# Patient Record
Sex: Female | Born: 1960 | Hispanic: No | Marital: Married | Smoking: Never smoker
Health system: Southern US, Community
[De-identification: ages and names within clinical notes are randomized; demographics above are authoritative.]

## PROBLEM LIST (undated history)

## (undated) DIAGNOSIS — R519 Headache, unspecified: Secondary | ICD-10-CM

## (undated) DIAGNOSIS — R51 Headache: Secondary | ICD-10-CM

## (undated) DIAGNOSIS — R42 Dizziness and giddiness: Secondary | ICD-10-CM

## (undated) DIAGNOSIS — I10 Essential (primary) hypertension: Secondary | ICD-10-CM

## (undated) HISTORY — DX: Dizziness and giddiness: R42

## (undated) HISTORY — DX: Headache, unspecified: R51.9

## (undated) HISTORY — PX: OTHER SURGICAL HISTORY: SHX169

## (undated) HISTORY — DX: Essential (primary) hypertension: I10

## (undated) HISTORY — DX: Headache: R51

---

## 1998-06-15 HISTORY — PX: CHOLECYSTECTOMY: SHX55

## 2006-09-27 ENCOUNTER — Emergency Department: Payer: Self-pay | Admitting: Emergency Medicine

## 2008-01-26 ENCOUNTER — Emergency Department: Payer: Self-pay | Admitting: Emergency Medicine

## 2009-03-18 ENCOUNTER — Ambulatory Visit: Payer: Self-pay | Admitting: Family Medicine

## 2009-04-03 ENCOUNTER — Ambulatory Visit: Payer: Self-pay | Admitting: Gastroenterology

## 2009-04-05 ENCOUNTER — Ambulatory Visit: Payer: Self-pay | Admitting: Unknown Physician Specialty

## 2009-04-20 ENCOUNTER — Emergency Department: Payer: Self-pay | Admitting: Emergency Medicine

## 2009-05-21 ENCOUNTER — Ambulatory Visit: Payer: Self-pay | Admitting: Obstetrics and Gynecology

## 2009-05-30 ENCOUNTER — Observation Stay: Payer: Self-pay | Admitting: Specialist

## 2009-07-03 ENCOUNTER — Ambulatory Visit: Payer: Self-pay | Admitting: Gastroenterology

## 2009-07-04 ENCOUNTER — Ambulatory Visit: Payer: Self-pay | Admitting: Internal Medicine

## 2009-12-13 ENCOUNTER — Emergency Department: Payer: Self-pay | Admitting: Emergency Medicine

## 2010-06-17 IMAGING — CR DG CHEST 1V PORT
1 series · 1 of 1 positions shown · non-contrast
Comparison: none

REASON FOR EXAM: Chest Pain
COMMENTS:

[view not recorded]
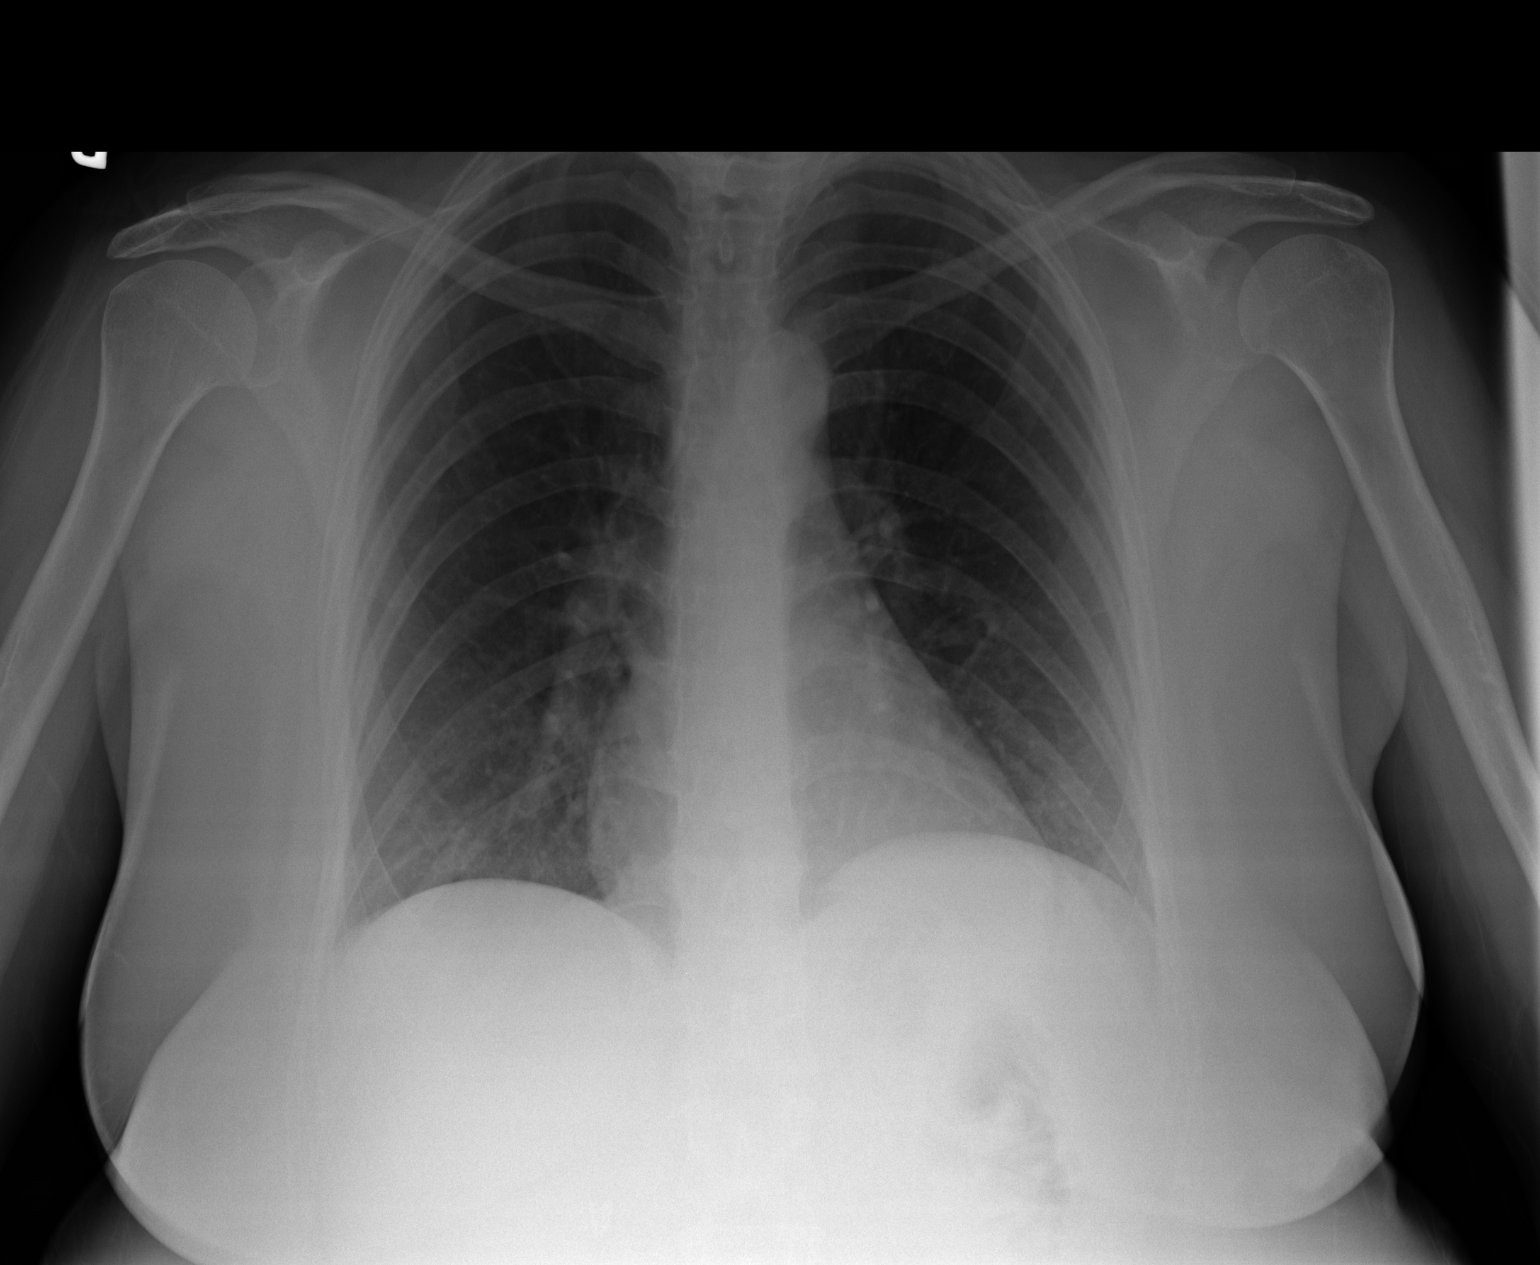

[1 of 1 positions shown; findings below may reference images not displayed]

PROCEDURE:     DXR - DXR PORTABLE CHEST SINGLE VIEW  - April 21, 2009  [DATE]

RESULT:     Comparison is made to the exam of 09/27/2006.

The lungs are clear. The heart and pulmonary vessels are normal. The bony
and mediastinal structures are unremarkable. There is no effusion. There is
no pneumothorax or evidence of congestive failure.
IMPRESSION: No acute cardiopulmonary disease.

## 2012-03-03 ENCOUNTER — Ambulatory Visit: Payer: Self-pay | Admitting: Unknown Physician Specialty

## 2013-08-13 HISTORY — PX: BREAST SURGERY: SHX581

## 2013-08-16 ENCOUNTER — Ambulatory Visit: Payer: Self-pay

## 2013-08-17 ENCOUNTER — Ambulatory Visit: Payer: Self-pay

## 2013-09-04 ENCOUNTER — Ambulatory Visit (INDEPENDENT_AMBULATORY_CARE_PROVIDER_SITE_OTHER): Payer: PRIVATE HEALTH INSURANCE | Admitting: General Surgery

## 2013-09-04 ENCOUNTER — Encounter: Payer: Self-pay | Admitting: General Surgery

## 2013-09-04 VITALS — BP 140/90 | HR 74 | Resp 12 | Ht 61.0 in | Wt 158.0 lb

## 2013-09-04 DIAGNOSIS — R21 Rash and other nonspecific skin eruption: Secondary | ICD-10-CM

## 2013-09-04 NOTE — Patient Instructions (Signed)
Patient to return in one week nurse visit.

## 2013-09-04 NOTE — Progress Notes (Signed)
Patient ID: Julie Cantu, female   DOB: 09/11/1960, 53 y.o.   MRN: [id number]  Chief Complaint  Patient presents with  . Other    mammogram    HPI Julie Cantu is a 53 y.o. female who presents for a breast evaluation Ref. Julie Cantu.The most recent mammogram and ultrasound was done on 08/17/13. Patient states her left breast has red spots on the top of her breast and has been there since January 2015. The patient denies any trauma or change in laundry detergent/underclothes. There is no pruritus associated with the areas. No past history of similar episodes involving the breast or other areas of the skin.  Patient states she does not feel any lumps.Patient does perform regular self breast checks and does not get regular mammograms done. (These are started at age 11 in [place]). No family history of breast cancer.  The patient was born in [place], but raised in [place]. She is accompanied today by her husband who was born in [place], raised in [place]. The met while at [facility] in [place]. 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 Her husband was present for the interview and exam.  The patient reports of the most superior area of redness developed first at the 11 o'clock position, the secondary to develop was at the 5 o'clock position left breast and the most recent area beginning about 3 weeks ago was just outside the edge of the areola at the 3 o'clock position. The areas. The degree of redness. Her husband reports that at times it seems the nipple/areolar tissue is more swollen. She denies any nipple drainage.  HPI  Past Medical History  Diagnosis Date  . Hypertension   . Headache   . Dizziness     Past Surgical History  Procedure Laterality Date  . Arm surgery Right   . Cholecystectomy  2000    No family history on file.  Social History History  Substance Use Topics  . Smoking status: Never Smoker   . Smokeless  tobacco: Never Used  . Alcohol Use: No    Allergies  Allergen Reactions  . Ivp Dye [Iodinated Diagnostic Agents] Rash    Current Outpatient Prescriptions  Medication Sig Dispense Refill  . ibuprofen (ADVIL,MOTRIN) 200 MG tablet Take 200 mg by mouth every 6 (six) hours as needed.      Marland Kitchen lisinopril (PRINIVIL,ZESTRIL) 10 MG tablet Take 10 mg by mouth daily.      . Nutritional Supplements (ESTROVEN ENERGY PO) Take by mouth.      . propranolol (INDERAL) 40 MG tablet Take 40 mg by mouth 3 (three) times daily.      . verapamil (CALAN) 120 MG tablet Take 120 mg by mouth 3 (three) times daily.       No current facility-administered medications for this visit.    Review of Systems Review of Systems  Constitutional: Negative.   Respiratory: Negative.   Cardiovascular: Negative.     Blood pressure 140/90, pulse 74, resp. rate 12, height _0  (1.549 m), weight 158 lb (71.668 kg).  Physical Exam Physical Exam  Constitutional: She is oriented to person, place, and time. She appears well-developed and well-nourished.  Eyes: Conjunctivae are normal.  Neck: Neck supple.  Cardiovascular: Normal rate, regular rhythm and normal heart sounds.   Pulmonary/Chest: Effort normal and breath sounds normal. Right breast exhibits no inverted nipple, no mass, no nipple discharge, no skin change and no tenderness. Left breast exhibits no inverted nipple,  no mass, no nipple discharge, no skin change and no tenderness.  Lymphadenopathy:    She has no cervical adenopathy.    She has no axillary adenopathy.  Neurological: She is alert and oriented to person, place, and time.  Skin: Skin is warm and dry.    Data Reviewed Bilateral mammograms in August 17, 2013 were reviewed. No films for comparison. No abnormalities appreciated. Ultrasound reported as negative. BI-RAD-1.  Assessment    Atypical rash involving the left breast.     Plan    With the reports of intermittent swelling and increased redness  in the retroareolar tissue, biopsy was recommended to assess for a deeper inflammatory process. The patient was amenable to have this completed. 3 cc of 1% plain Xylocaine was utilized. The skin was prepped with ChloraPrep and draped. A 2.5 mm punch biopsy of the skin was completed and sent in formalin for routine histology. This was completed just at the edge of the areola. The skin defect was closed with a single 4-0 nylon suture followed by a Telfa and Tegaderm dressing.  The patient was instructed she may remove the outer dressing in 2-3 days, and shower at any time. She will return in one week for suture removal with the staff. She be contacted once pathology is available.        Robert Bellow 09/04/2013, 9:22 PM

## 2013-09-05 LAB — PATHOLOGY

## 2013-09-06 ENCOUNTER — Telehealth: Payer: Self-pay | Admitting: *Deleted

## 2013-09-06 MED ORDER — CLOBETASOL PROPIONATE 0.05 % EX CREA: 1.0000 | TOPICAL_CREAM | Freq: Two times a day (BID) | CUTANEOUS | Status: DC

## 2013-09-06 NOTE — Telephone Encounter (Signed)
Let the patient know that the biopsies came out without any evidence of malignancy. There is a suggestion of an inflammatory process. The pathologist was concerned it might be related to one of the medications she has been taking. Find out if any of her medications have been added in the last 6 months. See if she is taking anything besides the 3 blood pressure pills, the Estroven and ibuprofen, and how long she been taking any of those medications. Send in prescriptions for clobetasol cream 0.05%, 30 g to be applied twice a day. She should give us a progress report in 3 weeks by phone.

## 2013-09-06 NOTE — Telephone Encounter (Signed)
Notified patient as instructed, patient pleased. Discussed follow-up appointments, patient agrees. Medications reviewed Verapamil she takes PRN last dose 09-05-13 and she has been on that >6 months. Propranolol BID started in 2004. Lisinopril she takes as needed last dose was one week ago. Nutritional OTC energy supplement has been with in the past 6 months She is aware that a new cream was called is aware to call back with status report.

## 2013-09-11 ENCOUNTER — Ambulatory Visit (INDEPENDENT_AMBULATORY_CARE_PROVIDER_SITE_OTHER): Payer: Self-pay | Admitting: *Deleted

## 2013-09-11 DIAGNOSIS — R21 Rash and other nonspecific skin eruption: Secondary | ICD-10-CM

## 2013-09-11 NOTE — Progress Notes (Signed)
Patient came in today for a wound check right breast rash.  The wound is clean, with no signs of infection noted,sutures removed. Follow up as scheduled.

## 2013-09-28 ENCOUNTER — Encounter: Payer: Self-pay | Admitting: General Surgery

## 2013-09-28 ENCOUNTER — Ambulatory Visit (INDEPENDENT_AMBULATORY_CARE_PROVIDER_SITE_OTHER): Payer: Self-pay | Admitting: General Surgery

## 2013-09-28 VITALS — BP 132/78 | HR 76 | Resp 12 | Ht 61.0 in | Wt 147.0 lb

## 2013-09-28 DIAGNOSIS — R21 Rash and other nonspecific skin eruption: Secondary | ICD-10-CM

## 2013-09-28 NOTE — Patient Instructions (Signed)
Patient to return as needed. Continue self breast exams. Call office for any new breast issues or concerns.'  

## 2013-09-28 NOTE — Progress Notes (Signed)
Patient ID: Julie Cantu, female   DOB: Sep 11, 1960, 53 y.o.   MRN: [id number]  Chief Complaint  Patient presents with  . Routine Post Op    left breast biopsy    HPI Julie Cantu is a [age] y.o. female here today for her post op left breast biopsy which was done on 09/04/13. Patient states she had some redness but now has cleaned up.  She denise  any new breast problems at this time.  HPI  Past Medical History  Diagnosis Date  . Hypertension   . Headache   . Dizziness     Past Surgical History  Procedure Laterality Date  . Arm surgery Right   . Cholecystectomy  2000  . Breast surgery Right 08/2013    right breast core     No family history on file.  Social History History  Substance Use Topics  . Smoking status: Never Smoker   . Smokeless tobacco: Never Used  . Alcohol Use: No    Allergies  Allergen Reactions  . Ivp Dye [Iodinated Diagnostic Agents] Rash    Current Outpatient Prescriptions  Medication Sig Dispense Refill  . clobetasol cream (TEMOVATE) 0.05 % Apply 1 application topically 2 (two) times daily.  30 g  0  . ibuprofen (ADVIL,MOTRIN) 200 MG tablet Take 200 mg by mouth every 6 (six) hours as needed.      Marland Kitchen. lisinopril (PRINIVIL,ZESTRIL) 10 MG tablet Take 10 mg by mouth as needed.       . Nutritional Supplements (ESTROVEN ENERGY PO) Take by mouth.      . propranolol (INDERAL) 40 MG tablet Take 40 mg by mouth 2 (two) times daily.       . verapamil (CALAN) 120 MG tablet Take 120 mg by mouth as needed.        No current facility-administered medications for this visit.    Review of Systems Review of Systems  Constitutional: Negative.   Respiratory: Negative.   Cardiovascular: Negative.     Blood pressure 132/78, pulse 76, resp. rate 12, height 5\' 1"  (1.549 m), weight 147 lb (66.679 kg).  Physical Exam Physical Exam  Pulmonary/Chest:    Left breast well healed incision 6 mm area of redness at biopsy site.    Data  Reviewed Diagnosis: LEFT BREAST 3:00 CORE BIOPSY *STAT*: - MILD SUPERFICIAL PERIVASCULAR LYMPHOPLASMACYTIC INFLAMMATION WITH FOCAL EOSINOPHILS. - NEGATIVE FOR MALIGNANCY.    Assessment    Resolving rash, unclear etiology.     Plan    The patient will report if she develops recurrent symptoms, follow up otherwise will be on an as-needed basis.        Merrily PewJeffrey W Adonys Wildes 09/28/2013, 8:58 PM

## 2019-06-27 ENCOUNTER — Other Ambulatory Visit: Payer: Self-pay

## 2020-03-08 ENCOUNTER — Other Ambulatory Visit: Payer: Self-pay | Admitting: Obstetrics and Gynecology

## 2020-03-08 DIAGNOSIS — Z1231 Encounter for screening mammogram for malignant neoplasm of breast: Secondary | ICD-10-CM

## 2020-06-10 ENCOUNTER — Other Ambulatory Visit: Payer: Self-pay

## 2020-06-10 DIAGNOSIS — Z20822 Contact with and (suspected) exposure to covid-19: Secondary | ICD-10-CM

## 2020-06-11 LAB — SARS-COV-2, NAA 2 DAY TAT

## 2020-06-11 LAB — NOVEL CORONAVIRUS, NAA: SARS-CoV-2, NAA: NOT DETECTED

## 2020-07-29 ENCOUNTER — Ambulatory Visit: Payer: BLUE CROSS/BLUE SHIELD | Admitting: Physical Therapy

## 2021-04-29 ENCOUNTER — Other Ambulatory Visit: Payer: Self-pay | Admitting: Obstetrics and Gynecology

## 2021-04-29 DIAGNOSIS — Z1231 Encounter for screening mammogram for malignant neoplasm of breast: Secondary | ICD-10-CM

## 2021-08-15 ENCOUNTER — Ambulatory Visit
Admission: RE | Admit: 2021-08-15 | Discharge: 2021-08-15 | Disposition: A | Discharge status: Home / Self Care | Payer: 59 | Source: Ambulatory Visit | Attending: Obstetrics and Gynecology | Admitting: Obstetrics and Gynecology

## 2021-08-15 ENCOUNTER — Other Ambulatory Visit: Payer: Self-pay

## 2021-08-15 DIAGNOSIS — Z1231 Encounter for screening mammogram for malignant neoplasm of breast: Secondary | ICD-10-CM | POA: Diagnosis present

## 2021-10-10 ENCOUNTER — Other Ambulatory Visit: Payer: Self-pay

## 2021-10-10 ENCOUNTER — Emergency Department
Admission: EM | Admit: 2021-10-10 | Discharge: 2021-10-10 | Disposition: A | Discharge status: Home / Self Care | Payer: 59 | Attending: Emergency Medicine | Admitting: Emergency Medicine

## 2021-10-10 ENCOUNTER — Emergency Department: Payer: 59

## 2021-10-10 DIAGNOSIS — I10 Essential (primary) hypertension: Secondary | ICD-10-CM | POA: Insufficient documentation

## 2021-10-10 DIAGNOSIS — R109 Unspecified abdominal pain: Secondary | ICD-10-CM | POA: Insufficient documentation

## 2021-10-10 DIAGNOSIS — R519 Headache, unspecified: Secondary | ICD-10-CM

## 2021-10-10 LAB — CBC
HCT: 44.7 % (ref 36.0–46.0)
Hemoglobin: 15.6 g/dL — ABNORMAL HIGH (ref 12.0–15.0)
MCH: 30.2 pg (ref 26.0–34.0)
MCHC: 34.9 g/dL (ref 30.0–36.0)
MCV: 86.5 fL (ref 80.0–100.0)
Platelets: 304 10*3/uL (ref 150–400)
RBC: 5.17 MIL/uL — ABNORMAL HIGH (ref 3.87–5.11)
RDW: 13 % (ref 11.5–15.5)
WBC: 11.8 10*3/uL — ABNORMAL HIGH (ref 4.0–10.5)
nRBC: 0 % (ref 0.0–0.2)

## 2021-10-10 LAB — URINALYSIS, ROUTINE W REFLEX MICROSCOPIC
Bilirubin Urine: NEGATIVE
Glucose, UA: NEGATIVE mg/dL
Hgb urine dipstick: NEGATIVE
Ketones, ur: NEGATIVE mg/dL
Leukocytes,Ua: NEGATIVE
Nitrite: NEGATIVE
Protein, ur: NEGATIVE mg/dL
Specific Gravity, Urine: 1.009 (ref 1.005–1.030)
pH: 7 (ref 5.0–8.0)

## 2021-10-10 LAB — BASIC METABOLIC PANEL
Anion gap: 8 (ref 5–15)
BUN: 16 mg/dL (ref 6–20)
CO2: 26 mmol/L (ref 22–32)
Calcium: 9.4 mg/dL (ref 8.9–10.3)
Chloride: 105 mmol/L (ref 98–111)
Creatinine, Ser: 0.68 mg/dL (ref 0.44–1.00)
GFR, Estimated: >60 mL/min (ref >60)
Glucose, Bld: 110 mg/dL — ABNORMAL HIGH (ref 70–99)
Potassium: 3.6 mmol/L (ref 3.5–5.1)
Sodium: 139 mmol/L (ref 135–145)

## 2021-10-10 LAB — TROPONIN I (HIGH SENSITIVITY): Troponin I (High Sensitivity): 6 ng/L (ref <18)

## 2021-10-10 MED ORDER — CLONIDINE HCL 0.1 MG PO TABS: 0.1000 mg | ORAL_TABLET | Freq: Two times a day (BID) | ORAL | 0 refills | Status: DC | PRN

## 2021-10-10 MED ORDER — ACETAMINOPHEN 500 MG PO TABS
1000.0000 mg | ORAL_TABLET | Freq: Once | ORAL | Status: AC
  Administered 2021-10-10: 1000 mg via ORAL
  Filled 2021-10-10: qty 2

## 2021-10-10 MED ORDER — CLONIDINE HCL 0.1 MG PO TABS
0.1000 mg | ORAL_TABLET | Freq: Once | ORAL | Status: AC
  Administered 2021-10-10: 0.1 mg via ORAL
  Filled 2021-10-10: qty 1

## 2021-10-10 NOTE — Discharge Instructions (Addendum)
For your blood pressure: ? ?If your headache resolves and you otherwise feel well, you likely do not need to take any additional medication. ? ?IF you develop headache or other symptoms, and your BP remains >180 on top or >100 on bottom: ?- Take the Clonidine 0.1 mg tablet, rest, and re-check in 20-30 minutes ?- If symptoms are not improved, call your doctor or present to the ED ?- You can take the clonidine up to twice a day ? ?Call your primary to discuss further medication changes as indicated ? ?Take Tylenol for headache as well ?

## 2021-10-10 NOTE — ED Notes (Signed)
Patient is at imaging. 

## 2021-10-10 NOTE — ED Triage Notes (Addendum)
Pt arrives with c/o hypertension and headache that started this morning. Pt has been taking BP as prescribed. Pt takes lopressor. Pt endorse nausea.  ?

## 2021-10-10 NOTE — ED Provider Notes (Signed)
? ?[facility] ?Provider Note ? ? ? Event Date/Time  ? First MD Initiated Contact with Patient 10/10/21 1814   ?  (approximate) ? ? ?History  ? ?Hypertension ? ? ?HPI ? ?Agripina Olortegui-Green is a 61 y.o. female with past medical history of hypertension here with headache, high blood pressure.  The patient states that for the last 2 days or so, she has had a fairly constant aching, throbbing, headache.  She has a history of hypertension and has felt similar symptoms with high blood pressure in the past.  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  She states the headache is mild, generalized, not acute in onset.  No focal numbness or weakness.  Denies any chest pain.  She has some transient left-sided flank pain, but states this has not been persistent.  She had some transient mild urinary frequency yesterday, but this is also resolved.  No fevers or chills.  No specific alleviating or aggravating factors. ?  ? ? ?Physical Exam  ? ?Triage Vital Signs: ?ED Triage Vitals  ?Enc Vitals Group  ?   BP 10/10/21 1611 (!) 176/100  ?   Pulse Rate 10/10/21 1611 (!) 56  ?   Resp 10/10/21 1611 18  ?   Temp 10/10/21 1611 99.1 ?F (37.3 ?C)  ?   Temp Source 10/10/21 1611 Oral  ?   SpO2 10/10/21 1611 98 %  ?   Weight 10/10/21 1612 148 lb (67.1 kg)  ?   Height 10/10/21 1612 5\' 2"  (1.575 m)  ?   Head Circumference --   ?   Peak Flow --   ?   Pain Score 10/10/21 1612 8  ?   Pain Loc --   ?   Pain Edu? --   ?   Excl. in GC? --   ? ? ?Most recent vital signs: ?Vitals:  ? 10/10/21 1936 10/10/21 1959  ?BP: (!) 168/84 (!) 160/75  ?Pulse: (!) 51 (!) 52  ?Resp:  16  ?Temp:  98.2 ?F (36.8 ?C)  ?SpO2:  97%  ? ? ? ?General: Awake, no distress.  ?CV:  Good peripheral perfusion.  Regular rate and rhythm.  No murmurs or rubs. ?Resp:  Normal effort.  Lungs clear to auscultation bilaterally. ?Abd:  No distention.  Minimal left flank tenderness to  palpation.  No CVA tenderness.  No rebound or guarding. ?Other:  Current nerves II through XII intact.  Strength out of 5 bilateral upper and lower extremities.  Normal sensation to light touch.  No focal neurological deficits. ? ? ?ED Results / Procedures / Treatments  ? ?Labs ?(all labs ordered are listed, but only abnormal results are displayed) ?Labs Reviewed  ?BASIC METABOLIC PANEL - Abnormal; Notable for the following components:  ?    Result Value  ? Glucose, Bld 110 (*)   ? All other components within normal limits  ?CBC - Abnormal; Notable for the following components:  ? WBC 11.8 (*)   ? RBC 5.17 (*)   ? Hemoglobin 15.6 (*)   ? All other components within normal limits  ?URINALYSIS, ROUTINE W REFLEX MICROSCOPIC - Abnormal; Notable for the following components:  ? Color, Urine STRAW (*)   ? APPearance CLEAR (*)   ? All other components within normal limits  ?TROPONIN I (HIGH SENSITIVITY)  ? ? ? ?EKG ?Sinus bradycardia, ventricular 51.  PR 148, QRS 86, QTc 4  7.  No acute ST elevations or depressions.  No acute events of acute ischemia or infarct. ? ? ?RADIOLOGY ?CT Head: [facility] ?CXR: No acute disease ? ? ?I also independently reviewed and agree with radiologist interpretations. ? ? ?PROCEDURES: ? ?Critical Care performed: No ? ? ?MEDICATIONS ORDERED IN ED: ?Medications  ?cloNIDine (CATAPRES) tablet 0.1 mg (0.1 mg Oral Given 10/10/21 1903)  ?acetaminophen (TYLENOL) tablet 1,000 mg (1,000 mg Oral Given 10/10/21 1903)  ? ? ? ?IMPRESSION / MDM / ASSESSMENT AND PLAN / ED COURSE  ?I reviewed the triage vital signs and the nursing notes. ?             ?               ? ? ?The patient is on the cardiac monitor to evaluate for evidence of arrhythmia and/or significant heart rate changes. ? ? ?Ddx:  ?Symptomatic HTN, migraine, tension headache, dehydration/electrolyte abnormality ? ? ?MDM:  ?61 year old female here with multiple complaints, primarily headache and intermittent blurry vision which I suspect is due to  hypertensive urgency.  Patient has a history of migraines, as well as symptomatic headaches with hypertension.  She received steroid injections on Monday, and symptoms began approximately 48 hours later, which I suspect is related.  Here, she has no focal neurological deficits.  No fever, meningismus, or signs to suggest meningitis or encephalitis.  Headache began gradually and I do not suspect subarachnoid clinically.  CT head obtained, reviewed, shows no evidence of acute abnormality.  Lab work is very reassuring with no evidence of acute renal failure, elevated troponin, or signs of hypertensive emergency.  Chest x-ray is clear with normal mediastinum, no signs of CHF.  She has some transient left-sided flank pain which seems more musculoskeletal in etiology as it is reproducible on exam and positional.  The pain is not sharp, tearing, do not suspect dissection clinically and pulses are symmetric bilaterally. ? ?Following clonidine, patient feels markedly improved.  I suspect she will likely have ongoing hypertension given her recent steroid use.  We will give her clonidine as needed for the next several days to have on hand, as well as have her call her PCP.  Otherwise, no apparent emergent medical pathology identified. ? ? ?MEDICATIONS GIVEN IN ED: ?Medications  ?cloNIDine (CATAPRES) tablet 0.1 mg (0.1 mg Oral Given 10/10/21 1903)  ?acetaminophen (TYLENOL) tablet 1,000 mg (1,000 mg Oral Given 10/10/21 1903)  ? ? ? ?Consults:  ?None ? ? ?EMR reviewed  ?Reviewed internal medicine notes from [facility], including office visit today ? ? ? ? ?FINAL CLINICAL IMPRESSION(S) / ED DIAGNOSES  ? ?Final diagnoses:  ?Primary hypertension  ?Acute nonintractable headache, unspecified headache type  ? ? ? ?Rx / DC Orders  ? ?ED Discharge Orders   ? ?      Ordered  ?  cloNIDine (CATAPRES) 0.1 MG tablet  2 times daily PRN       ? 10/10/21 1948  ? ?  ?  ? ?  ? ? ? ?Note:  This document was prepared using Dragon voice  recognition software and may include unintentional dictation errors. ?  ?Shaune Pollack, MD ?10/10/21 2207 ? ?

## 2021-11-28 ENCOUNTER — Other Ambulatory Visit: Payer: Self-pay | Admitting: Obstetrics and Gynecology

## 2021-12-30 ENCOUNTER — Other Ambulatory Visit: Payer: Self-pay | Admitting: Certified Nurse Midwife

## 2021-12-30 DIAGNOSIS — N644 Mastodynia: Secondary | ICD-10-CM

## 2021-12-31 ENCOUNTER — Other Ambulatory Visit: Payer: 59

## 2022-01-07 ENCOUNTER — Ambulatory Visit: Admit: 2022-01-07 | Payer: 59 | Admitting: Obstetrics and Gynecology

## 2022-01-07 SURGERY — DILATATION AND CURETTAGE /HYSTEROSCOPY
Anesthesia: Choice

## 2022-01-16 ENCOUNTER — Other Ambulatory Visit: Payer: 59

## 2022-01-16 ENCOUNTER — Inpatient Hospital Stay: Admission: RE | Admit: 2022-01-16 | Payer: 59 | Source: Ambulatory Visit

## 2022-02-10 ENCOUNTER — Other Ambulatory Visit: Payer: Self-pay | Admitting: Obstetrics and Gynecology

## 2022-03-02 ENCOUNTER — Ambulatory Visit
Admission: RE | Admit: 2022-03-02 | Discharge: 2022-03-02 | Disposition: A | Discharge status: Home / Self Care | Payer: 59 | Source: Ambulatory Visit | Attending: Certified Nurse Midwife | Admitting: Certified Nurse Midwife

## 2022-03-02 DIAGNOSIS — N644 Mastodynia: Secondary | ICD-10-CM | POA: Insufficient documentation

## 2022-03-02 DIAGNOSIS — B349 Viral infection, unspecified: Secondary | ICD-10-CM | POA: Diagnosis not present

## 2022-03-10 DIAGNOSIS — J019 Acute sinusitis, unspecified: Secondary | ICD-10-CM | POA: Diagnosis not present

## 2022-03-10 DIAGNOSIS — I10 Essential (primary) hypertension: Secondary | ICD-10-CM | POA: Diagnosis not present

## 2022-10-11 IMAGING — MG MM DIGITAL SCREENING BILAT W/ TOMO AND CAD
8 series · 8 of 24 positions shown · non-contrast
Comparison: Previous exam(s).

CLINICAL DATA: Screening.

EXAM:
DIGITAL SCREENING BILATERAL MAMMOGRAM WITH TOMOSYNTHESIS AND CAD
TECHNIQUE: Bilateral screening digital craniocaudal and mediolateral oblique
mammograms were obtained. Bilateral screening digital breast
tomosynthesis was performed. The images were evaluated with
computer-aided detection.

[L MLO synth-2D]
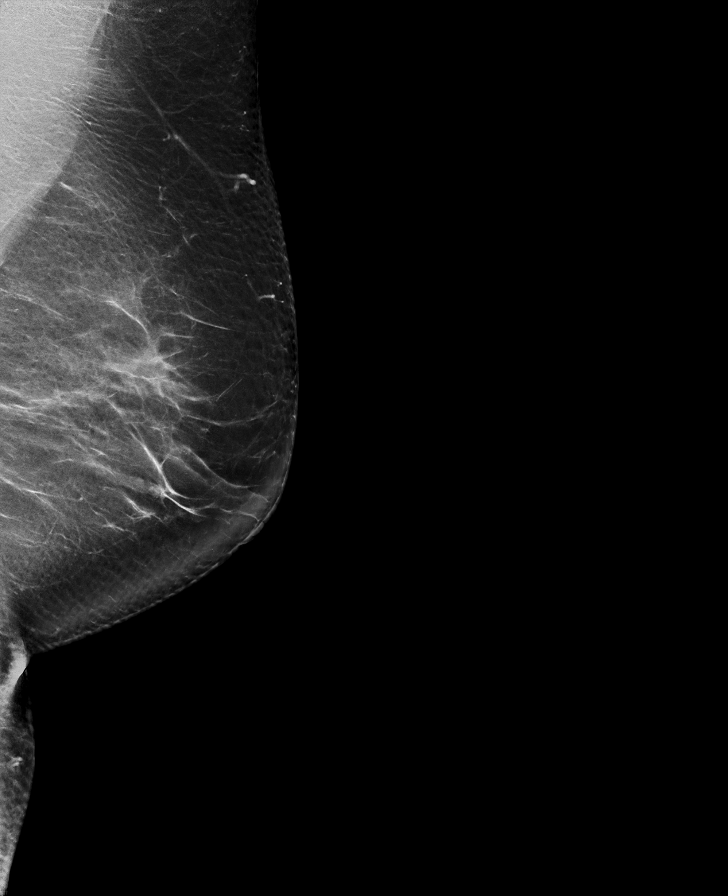

[R CC synth-2D]
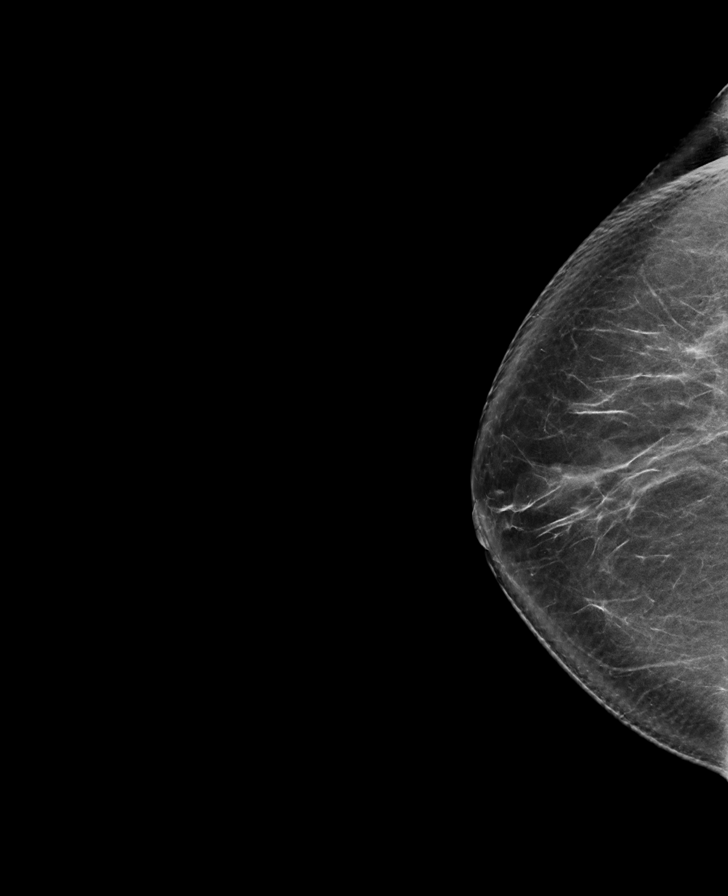

[L CC synth-2D]
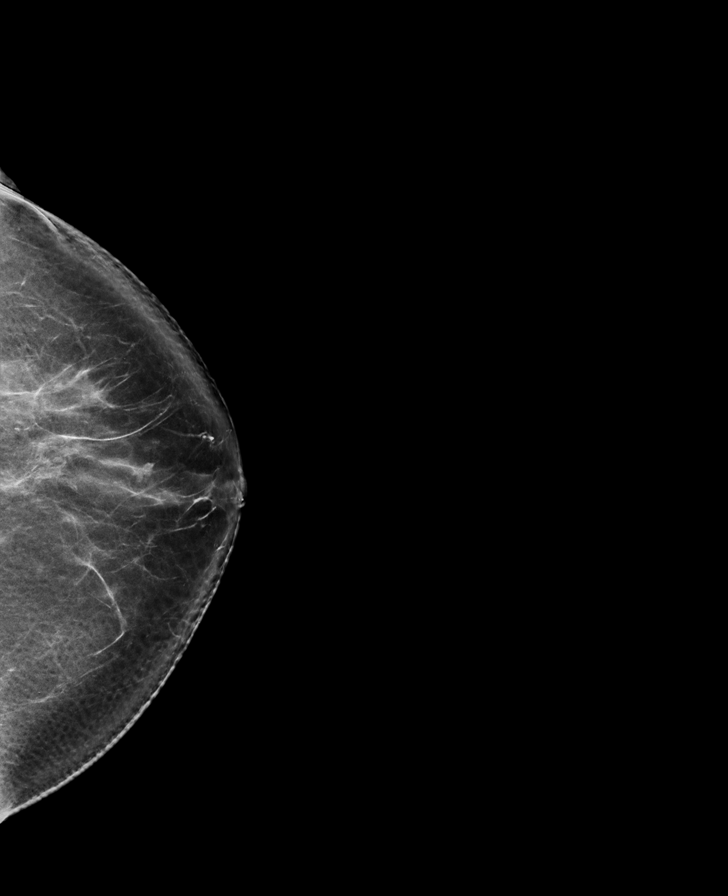

[R MLO synth-2D]
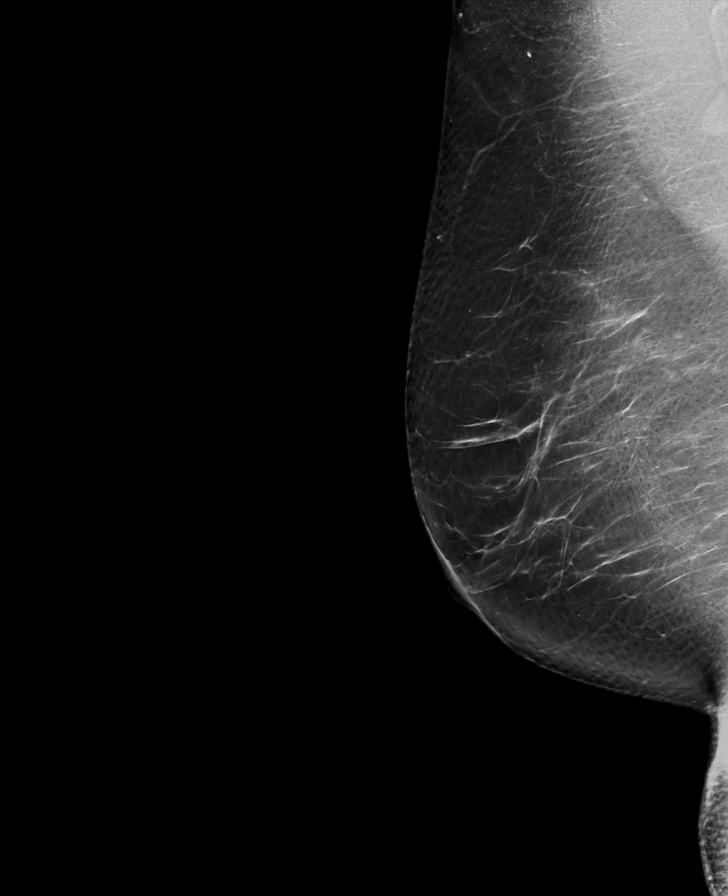

[L MLO tomo · tomo slice 49/96.0]
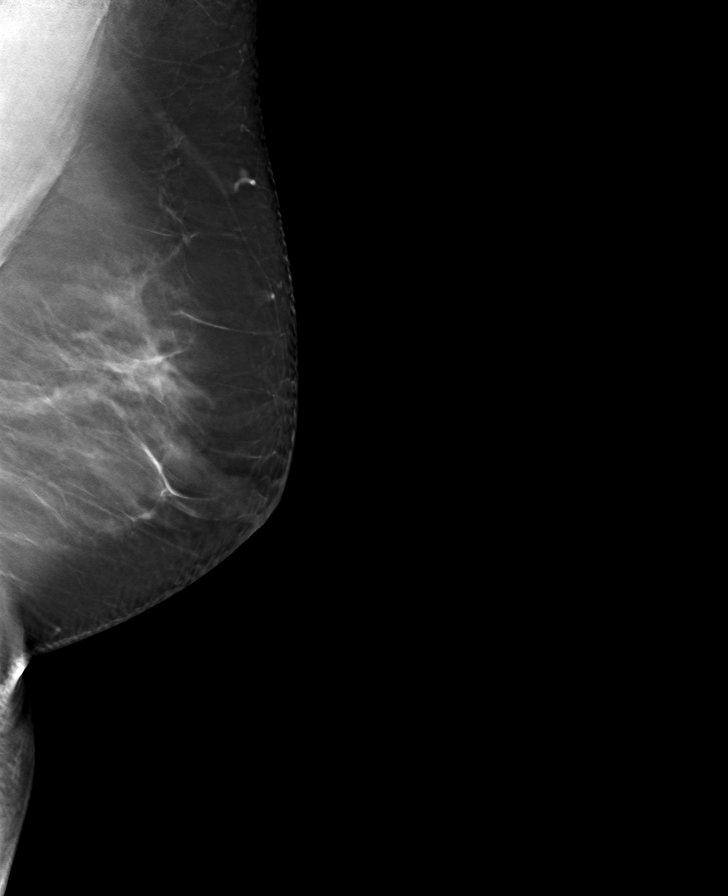

[R MLO tomo · tomo slice 49/97.0]
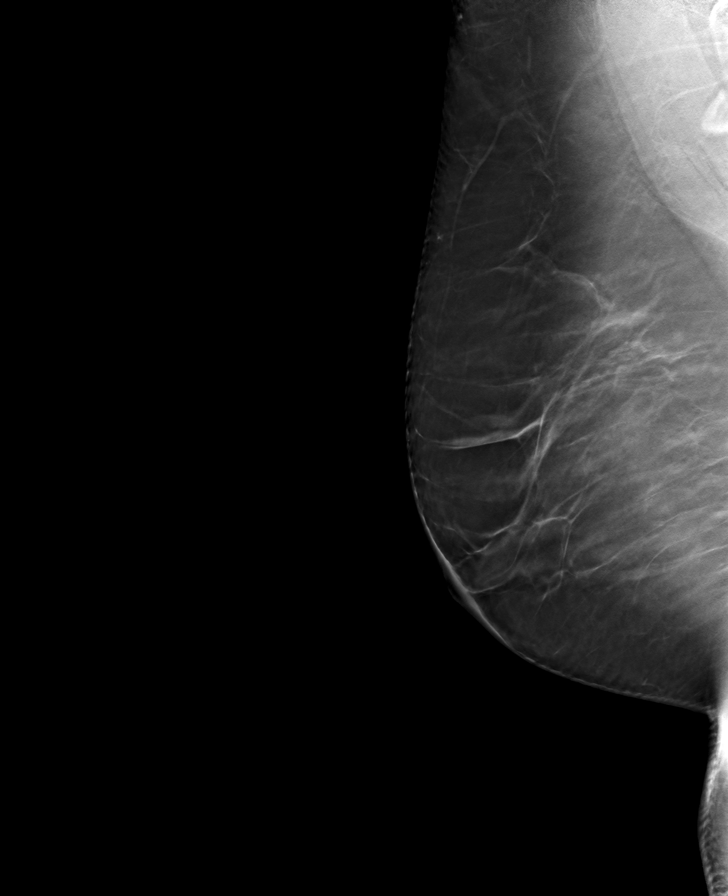

[R CC tomo · tomo slice 45/90.0]
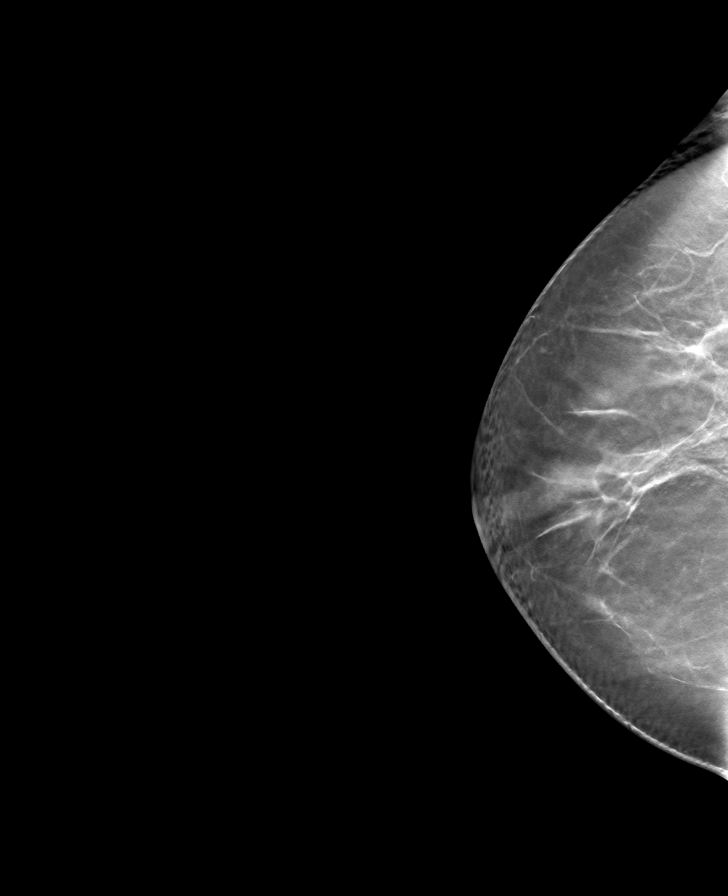

[L CC tomo · tomo slice 46/91.0]
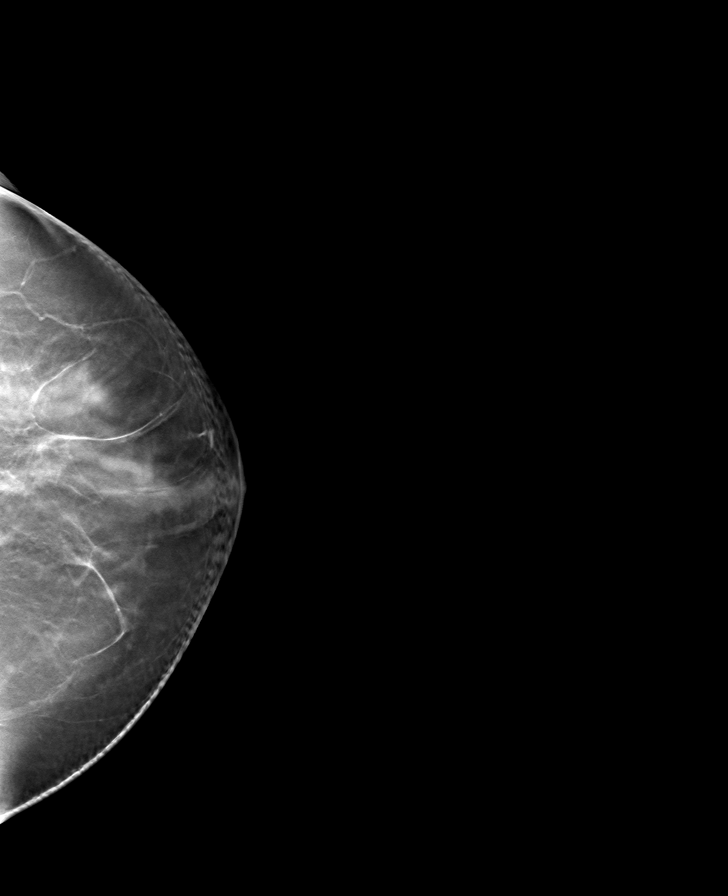

[8 of 24 positions shown; findings below may reference images not displayed]

ACR Breast Density Category b: There are scattered areas of
fibroglandular density.
FINDINGS: There are no findings suspicious for malignancy.
IMPRESSION: No mammographic evidence of malignancy. A result letter of this
screening mammogram will be mailed directly to the patient.

RECOMMENDATION:
Screening mammogram in one year. (Code:51-O-LD2)

BI-RADS CATEGORY  1: Negative.

## 2022-12-06 IMAGING — CT CT HEAD W/O CM
4 series · 16 of 47 positions shown, 18 images · non-contrast
Comparison: CT head 11/13/2009

CLINICAL DATA: Headache



[Series 2: head wo · axial · 0.44mm/px · z∈[-121,-1]mm · 7 of 33 slices shown, 9 images]
[im 5/33  brain]
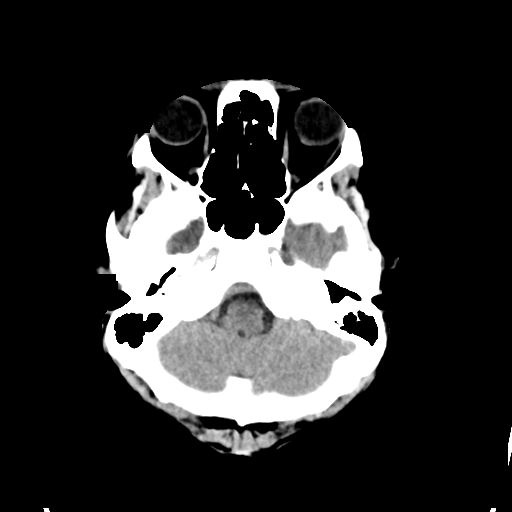
[im 5/33  bone]
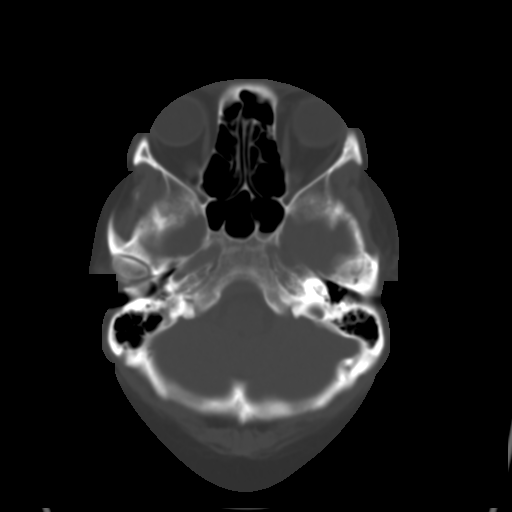
[im 9/33  brain]
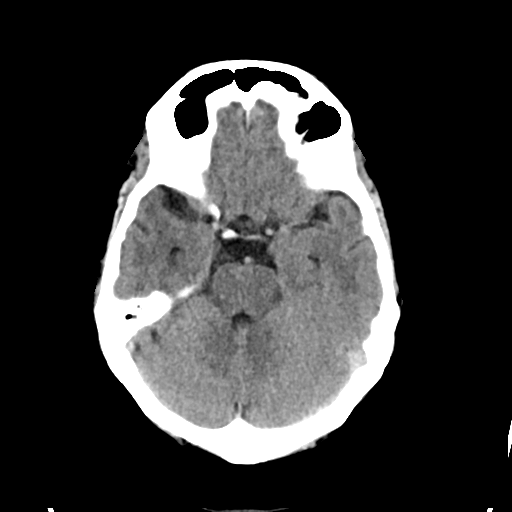
[im 13/33  brain]
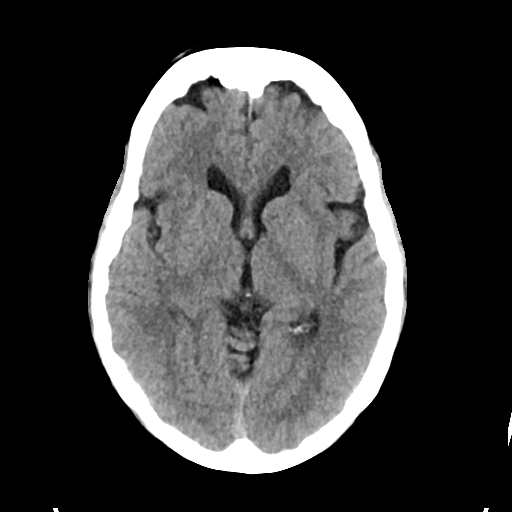
[im 17/33  brain]
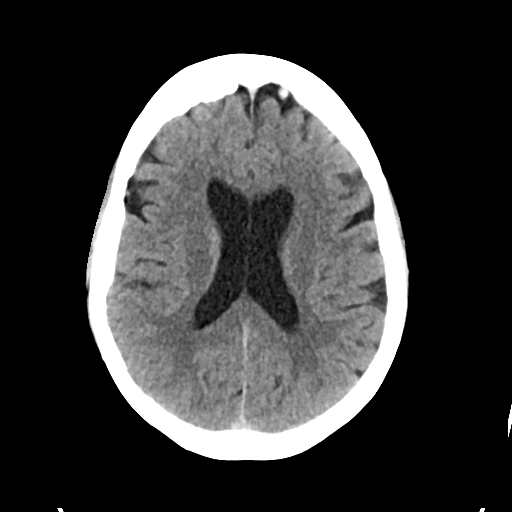
[im 21/33  brain]
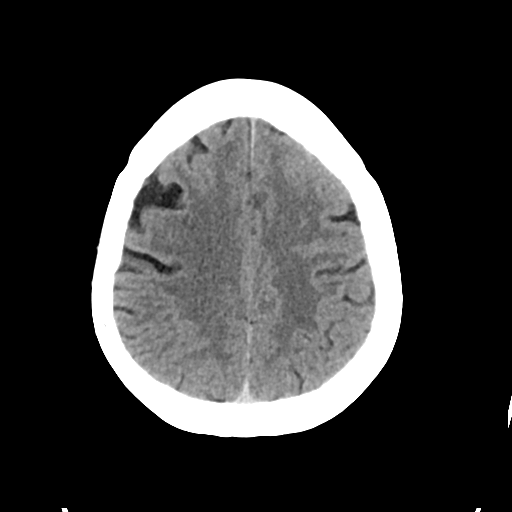
[im 21/33  bone]
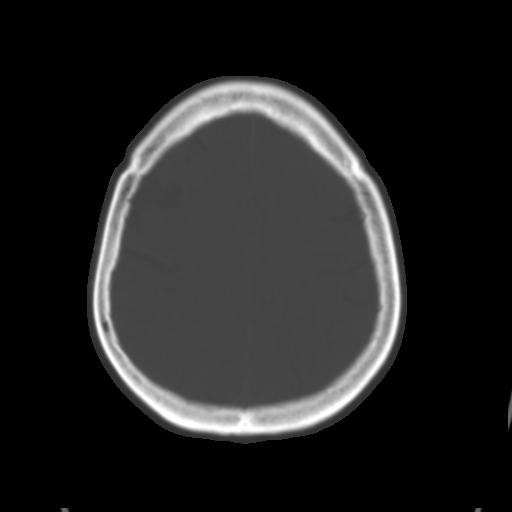
[im 25/33  brain]
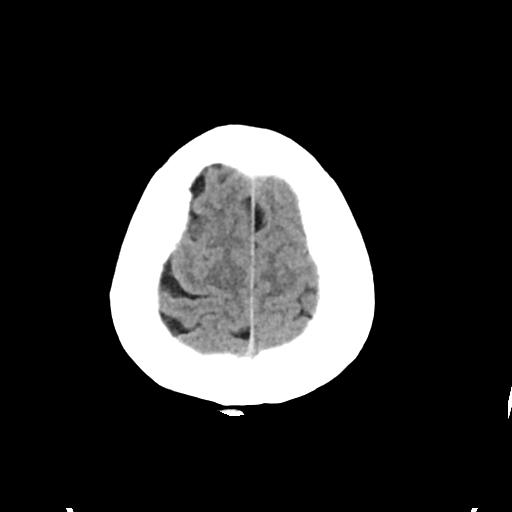
[im 29/33  brain]
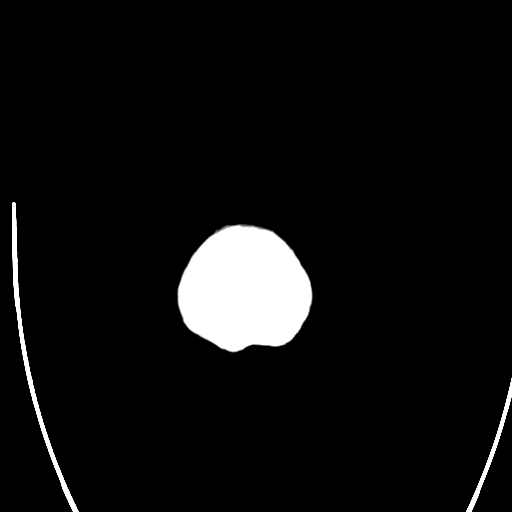

[Series 3: head bone · axial · 0.44mm/px · z∈[-125,-93]mm · 3 of 81 slices shown]
[im 9/81  bone]
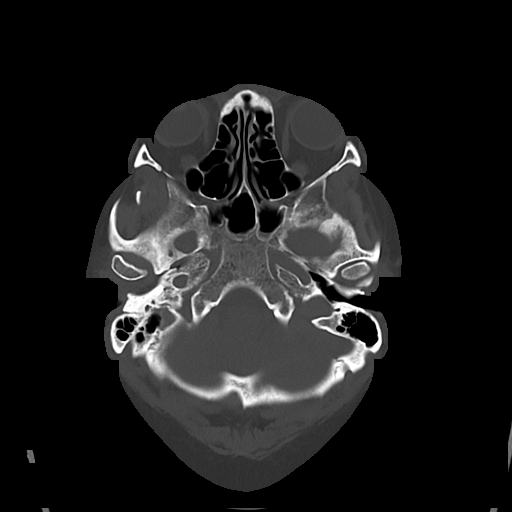
[im 17/81  bone]
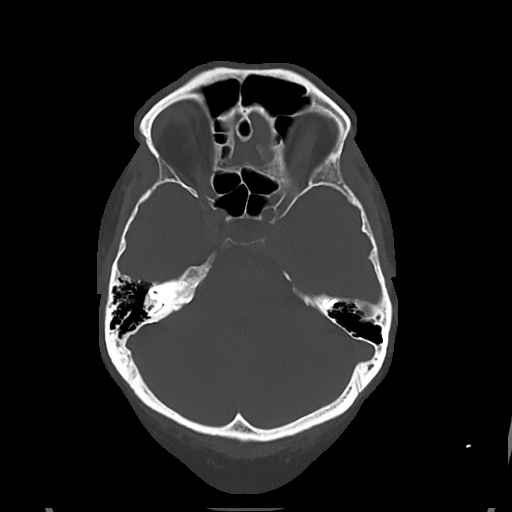
[im 25/81  bone]
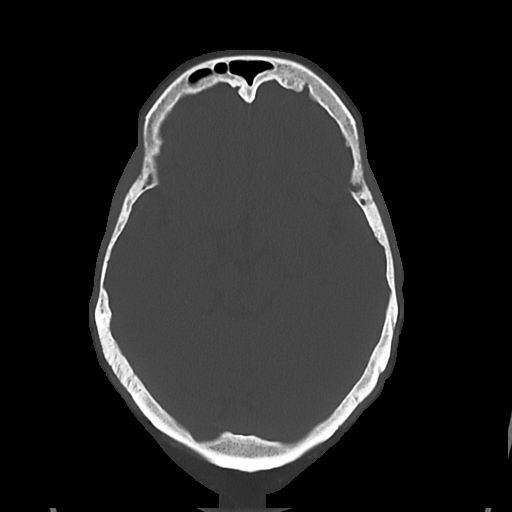

[Series 4: cor soft · coronal · 0.31mm/px · 3 of 67 slices shown]
[im 23/67  brain]
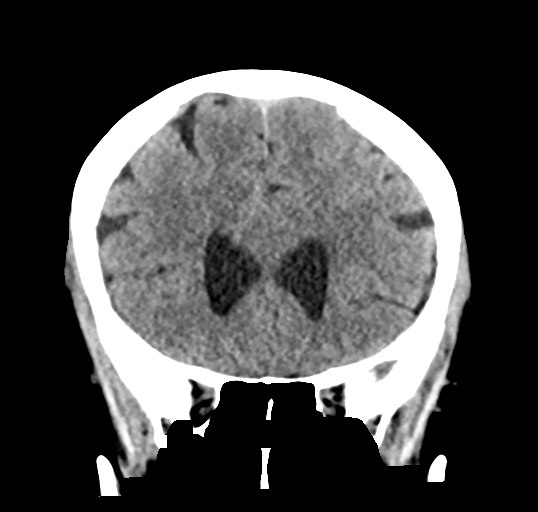
[im 30/67  brain]
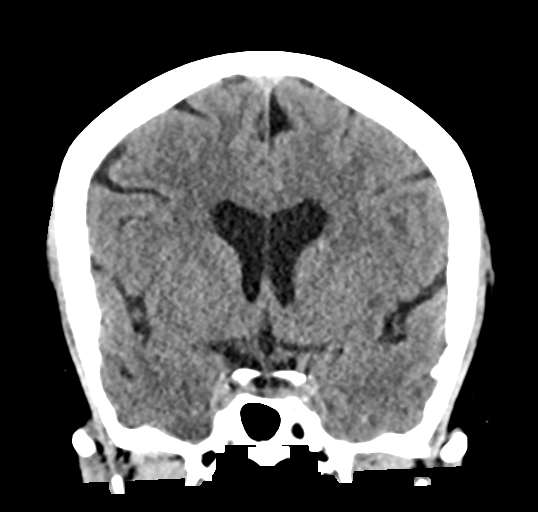
[im 37/67  brain]
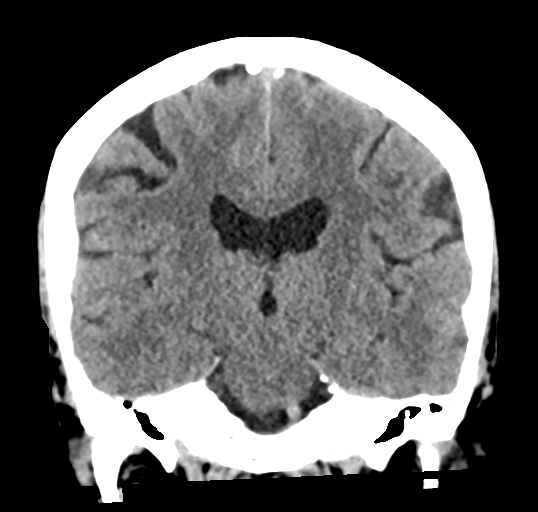

[Series 5: sag soft · sagittal · 0.31mm/px · 3 of 54 slices shown]
[im 18/54  brain]
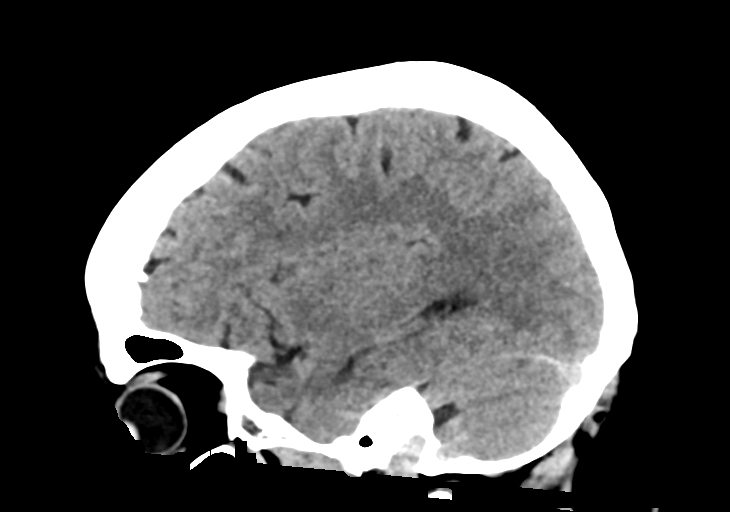
[im 27/54  brain]
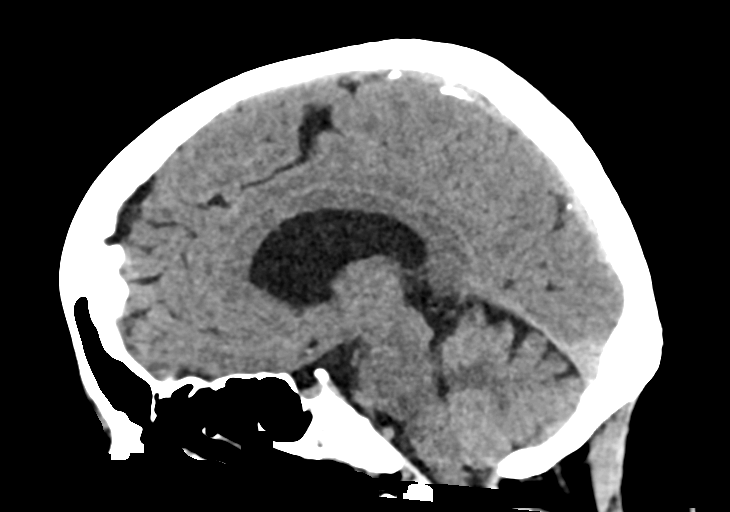
[im 36/54  brain]
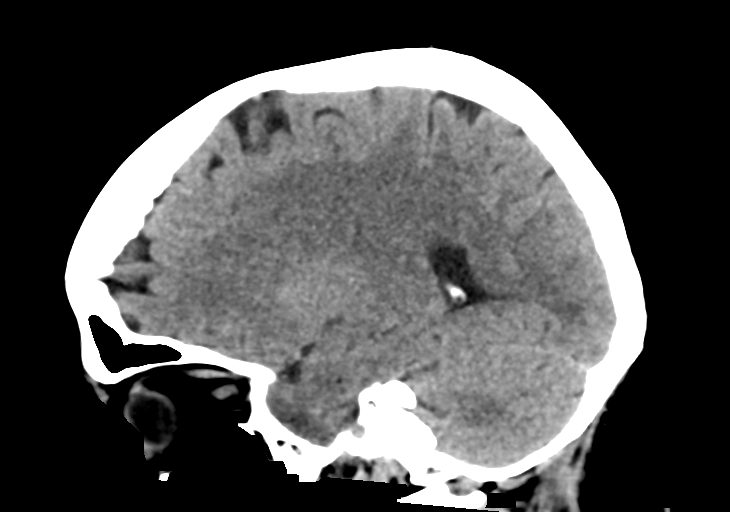

[16 of 47 positions shown; findings below may reference images not displayed]

FINDINGS: Brain: No acute intracranial hemorrhage, mass effect, or herniation.
No extra-axial fluid collections. No evidence of acute territorial
infarct. No hydrocephalus. Mild cortical volume loss. Prominent
perivascular space again noted at the right inferior basal ganglia.

Vascular: No hyperdense vessel or unexpected calcification.

Skull: Normal. Negative for fracture or focal lesion.

Sinuses/Orbits: No acute finding.

Other: None.
IMPRESSION: Chronic changes with no acute intracranial process identified.

## 2022-12-21 DIAGNOSIS — M9902 Segmental and somatic dysfunction of thoracic region: Secondary | ICD-10-CM | POA: Diagnosis not present

## 2022-12-21 DIAGNOSIS — M9903 Segmental and somatic dysfunction of lumbar region: Secondary | ICD-10-CM | POA: Diagnosis not present

## 2022-12-21 DIAGNOSIS — M9907 Segmental and somatic dysfunction of upper extremity: Secondary | ICD-10-CM | POA: Diagnosis not present

## 2022-12-21 DIAGNOSIS — M9901 Segmental and somatic dysfunction of cervical region: Secondary | ICD-10-CM | POA: Diagnosis not present

## 2022-12-23 DIAGNOSIS — M9907 Segmental and somatic dysfunction of upper extremity: Secondary | ICD-10-CM | POA: Diagnosis not present

## 2022-12-23 DIAGNOSIS — M9903 Segmental and somatic dysfunction of lumbar region: Secondary | ICD-10-CM | POA: Diagnosis not present

## 2022-12-23 DIAGNOSIS — M9902 Segmental and somatic dysfunction of thoracic region: Secondary | ICD-10-CM | POA: Diagnosis not present

## 2022-12-23 DIAGNOSIS — M9901 Segmental and somatic dysfunction of cervical region: Secondary | ICD-10-CM | POA: Diagnosis not present

## 2022-12-28 DIAGNOSIS — M9903 Segmental and somatic dysfunction of lumbar region: Secondary | ICD-10-CM | POA: Diagnosis not present

## 2022-12-28 DIAGNOSIS — M9907 Segmental and somatic dysfunction of upper extremity: Secondary | ICD-10-CM | POA: Diagnosis not present

## 2022-12-28 DIAGNOSIS — M9901 Segmental and somatic dysfunction of cervical region: Secondary | ICD-10-CM | POA: Diagnosis not present

## 2022-12-28 DIAGNOSIS — M9902 Segmental and somatic dysfunction of thoracic region: Secondary | ICD-10-CM | POA: Diagnosis not present

## 2022-12-29 DIAGNOSIS — M9907 Segmental and somatic dysfunction of upper extremity: Secondary | ICD-10-CM | POA: Diagnosis not present

## 2022-12-29 DIAGNOSIS — M9901 Segmental and somatic dysfunction of cervical region: Secondary | ICD-10-CM | POA: Diagnosis not present

## 2022-12-29 DIAGNOSIS — M9902 Segmental and somatic dysfunction of thoracic region: Secondary | ICD-10-CM | POA: Diagnosis not present

## 2022-12-29 DIAGNOSIS — M9903 Segmental and somatic dysfunction of lumbar region: Secondary | ICD-10-CM | POA: Diagnosis not present

## 2023-01-04 DIAGNOSIS — M9901 Segmental and somatic dysfunction of cervical region: Secondary | ICD-10-CM | POA: Diagnosis not present

## 2023-01-04 DIAGNOSIS — M9903 Segmental and somatic dysfunction of lumbar region: Secondary | ICD-10-CM | POA: Diagnosis not present

## 2023-01-04 DIAGNOSIS — M9907 Segmental and somatic dysfunction of upper extremity: Secondary | ICD-10-CM | POA: Diagnosis not present

## 2023-01-04 DIAGNOSIS — M9902 Segmental and somatic dysfunction of thoracic region: Secondary | ICD-10-CM | POA: Diagnosis not present

## 2023-01-06 DIAGNOSIS — M9902 Segmental and somatic dysfunction of thoracic region: Secondary | ICD-10-CM | POA: Diagnosis not present

## 2023-01-06 DIAGNOSIS — M9903 Segmental and somatic dysfunction of lumbar region: Secondary | ICD-10-CM | POA: Diagnosis not present

## 2023-01-06 DIAGNOSIS — M9907 Segmental and somatic dysfunction of upper extremity: Secondary | ICD-10-CM | POA: Diagnosis not present

## 2023-01-06 DIAGNOSIS — M9901 Segmental and somatic dysfunction of cervical region: Secondary | ICD-10-CM | POA: Diagnosis not present

## 2023-01-11 DIAGNOSIS — M9907 Segmental and somatic dysfunction of upper extremity: Secondary | ICD-10-CM | POA: Diagnosis not present

## 2023-01-11 DIAGNOSIS — M9903 Segmental and somatic dysfunction of lumbar region: Secondary | ICD-10-CM | POA: Diagnosis not present

## 2023-01-11 DIAGNOSIS — M9902 Segmental and somatic dysfunction of thoracic region: Secondary | ICD-10-CM | POA: Diagnosis not present

## 2023-01-11 DIAGNOSIS — M9901 Segmental and somatic dysfunction of cervical region: Secondary | ICD-10-CM | POA: Diagnosis not present

## 2023-01-13 DIAGNOSIS — M9902 Segmental and somatic dysfunction of thoracic region: Secondary | ICD-10-CM | POA: Diagnosis not present

## 2023-01-13 DIAGNOSIS — M9903 Segmental and somatic dysfunction of lumbar region: Secondary | ICD-10-CM | POA: Diagnosis not present

## 2023-01-13 DIAGNOSIS — M9907 Segmental and somatic dysfunction of upper extremity: Secondary | ICD-10-CM | POA: Diagnosis not present

## 2023-01-13 DIAGNOSIS — M9901 Segmental and somatic dysfunction of cervical region: Secondary | ICD-10-CM | POA: Diagnosis not present

## 2023-01-15 DIAGNOSIS — M9902 Segmental and somatic dysfunction of thoracic region: Secondary | ICD-10-CM | POA: Diagnosis not present

## 2023-01-15 DIAGNOSIS — M9901 Segmental and somatic dysfunction of cervical region: Secondary | ICD-10-CM | POA: Diagnosis not present

## 2023-01-15 DIAGNOSIS — M9903 Segmental and somatic dysfunction of lumbar region: Secondary | ICD-10-CM | POA: Diagnosis not present

## 2023-01-15 DIAGNOSIS — M9907 Segmental and somatic dysfunction of upper extremity: Secondary | ICD-10-CM | POA: Diagnosis not present

## 2023-02-03 DIAGNOSIS — M9901 Segmental and somatic dysfunction of cervical region: Secondary | ICD-10-CM | POA: Diagnosis not present

## 2023-02-03 DIAGNOSIS — M9907 Segmental and somatic dysfunction of upper extremity: Secondary | ICD-10-CM | POA: Diagnosis not present

## 2023-02-03 DIAGNOSIS — M9902 Segmental and somatic dysfunction of thoracic region: Secondary | ICD-10-CM | POA: Diagnosis not present

## 2023-02-03 DIAGNOSIS — M9903 Segmental and somatic dysfunction of lumbar region: Secondary | ICD-10-CM | POA: Diagnosis not present

## 2023-02-05 DIAGNOSIS — M9907 Segmental and somatic dysfunction of upper extremity: Secondary | ICD-10-CM | POA: Diagnosis not present

## 2023-02-05 DIAGNOSIS — M9901 Segmental and somatic dysfunction of cervical region: Secondary | ICD-10-CM | POA: Diagnosis not present

## 2023-02-05 DIAGNOSIS — M9903 Segmental and somatic dysfunction of lumbar region: Secondary | ICD-10-CM | POA: Diagnosis not present

## 2023-02-05 DIAGNOSIS — M9902 Segmental and somatic dysfunction of thoracic region: Secondary | ICD-10-CM | POA: Diagnosis not present

## 2023-02-08 DIAGNOSIS — M9903 Segmental and somatic dysfunction of lumbar region: Secondary | ICD-10-CM | POA: Diagnosis not present

## 2023-02-08 DIAGNOSIS — M9902 Segmental and somatic dysfunction of thoracic region: Secondary | ICD-10-CM | POA: Diagnosis not present

## 2023-02-08 DIAGNOSIS — M9907 Segmental and somatic dysfunction of upper extremity: Secondary | ICD-10-CM | POA: Diagnosis not present

## 2023-02-08 DIAGNOSIS — M9901 Segmental and somatic dysfunction of cervical region: Secondary | ICD-10-CM | POA: Diagnosis not present

## 2023-02-10 DIAGNOSIS — M9907 Segmental and somatic dysfunction of upper extremity: Secondary | ICD-10-CM | POA: Diagnosis not present

## 2023-02-10 DIAGNOSIS — M9903 Segmental and somatic dysfunction of lumbar region: Secondary | ICD-10-CM | POA: Diagnosis not present

## 2023-02-10 DIAGNOSIS — M9901 Segmental and somatic dysfunction of cervical region: Secondary | ICD-10-CM | POA: Diagnosis not present

## 2023-02-10 DIAGNOSIS — M9902 Segmental and somatic dysfunction of thoracic region: Secondary | ICD-10-CM | POA: Diagnosis not present

## 2023-02-17 DIAGNOSIS — M9902 Segmental and somatic dysfunction of thoracic region: Secondary | ICD-10-CM | POA: Diagnosis not present

## 2023-02-17 DIAGNOSIS — M9901 Segmental and somatic dysfunction of cervical region: Secondary | ICD-10-CM | POA: Diagnosis not present

## 2023-02-17 DIAGNOSIS — M9907 Segmental and somatic dysfunction of upper extremity: Secondary | ICD-10-CM | POA: Diagnosis not present

## 2023-02-17 DIAGNOSIS — M9903 Segmental and somatic dysfunction of lumbar region: Secondary | ICD-10-CM | POA: Diagnosis not present

## 2023-04-07 DIAGNOSIS — J029 Acute pharyngitis, unspecified: Secondary | ICD-10-CM | POA: Diagnosis not present

## 2023-04-14 DIAGNOSIS — I10 Essential (primary) hypertension: Secondary | ICD-10-CM | POA: Diagnosis not present

## 2023-04-14 DIAGNOSIS — R4701 Aphasia: Secondary | ICD-10-CM | POA: Diagnosis not present

## 2023-04-14 DIAGNOSIS — J0100 Acute maxillary sinusitis, unspecified: Secondary | ICD-10-CM | POA: Diagnosis not present

## 2023-04-14 DIAGNOSIS — R4189 Other symptoms and signs involving cognitive functions and awareness: Secondary | ICD-10-CM | POA: Diagnosis not present

## 2023-04-20 ENCOUNTER — Other Ambulatory Visit: Payer: Self-pay | Admitting: Internal Medicine

## 2023-04-20 DIAGNOSIS — R4189 Other symptoms and signs involving cognitive functions and awareness: Secondary | ICD-10-CM

## 2023-04-20 DIAGNOSIS — R4701 Aphasia: Secondary | ICD-10-CM

## 2023-04-26 ENCOUNTER — Ambulatory Visit
Admission: RE | Admit: 2023-04-26 | Discharge: 2023-04-26 | Disposition: A | Discharge status: Home / Self Care | Payer: 59 | Source: Ambulatory Visit | Attending: Internal Medicine | Admitting: Internal Medicine

## 2023-04-26 DIAGNOSIS — R4189 Other symptoms and signs involving cognitive functions and awareness: Secondary | ICD-10-CM

## 2023-04-26 DIAGNOSIS — R4701 Aphasia: Secondary | ICD-10-CM | POA: Diagnosis not present

## 2023-04-26 DIAGNOSIS — Z8673 Personal history of transient ischemic attack (TIA), and cerebral infarction without residual deficits: Secondary | ICD-10-CM | POA: Diagnosis not present

## 2023-05-05 DIAGNOSIS — I10 Essential (primary) hypertension: Secondary | ICD-10-CM | POA: Diagnosis not present

## 2023-05-05 DIAGNOSIS — R4701 Aphasia: Secondary | ICD-10-CM | POA: Diagnosis not present

## 2023-12-09 ENCOUNTER — Ambulatory Visit: Attending: Neurology | Admitting: Speech Pathology

## 2023-12-09 DIAGNOSIS — R41841 Cognitive communication deficit: Secondary | ICD-10-CM | POA: Diagnosis present

## 2023-12-09 NOTE — Therapy (Signed)
 OUTPATIENT SPEECH LANGUAGE PATHOLOGY  COGNITION EVALUATION   Patient Name: Julie Cantu MRN: [id number] DOB:[birth year], 63 y.o., female Today's Date: 12/10/2023  PCP: Oneil Pinal, MD REFERRING PROVIDER: Jannett Fairly, MD   End of Session - 12/10/23 1706     Visit Number 1    Number of Visits 17    Date for SLP Re-Evaluation 02/03/24    Authorization Type Aetna/[employer]na [employer]    Progress Note Due on Visit 10    SLP Start Time 0930    SLP Stop Time  1015    SLP Time Calculation (min) 45 min    Activity Tolerance Patient tolerated treatment well          Past Medical History:  Diagnosis Date   Dizziness    Headache    Hypertension    Past Surgical History:  Procedure Laterality Date   arm surgery Right    BREAST SURGERY Right 08/2013   right breast core    CHOLECYSTECTOMY  2000   Patient Active Problem List   Diagnosis Date Noted   Rash 09/04/2013    ONSET DATE: symptoms started mid to late 2024; date of referral 11/19/2023  REFERRING DIAG: G31.84 (ICD-10-CM) - Mild cognitive impairment   THERAPY DIAG:  Cognitive communication deficit  Rationale for Evaluation and Treatment Rehabilitation  SUBJECTIVE:   SUBJECTIVE STATEMENT: Pt pleasant, appears to be good historian Pt accompanied by: self  PERTINENT HISTORY and DIAGNOSTIC FINDINGS: Pt is a 63 year old female with medical history of fatting liver, obstructive sleep apnea, hypertension, migraine and cognitive issues since mid to late 2024, including fogginess, word-finding difficulty and memory problems. Dsylexia may conribute to these challenges as well as low Vit B12. Per chart Negative autoimmune dementia labs. Early onset Alzheimer's Disease genetic panel was negative. Patient may need to get [id number] drawn again at next visit (with neurologist).   MRI 04/26/2023 1. No evidence of an acute intracranial abnormality. 2. Mild chronic small vessel ischemic changes within the cerebral white  matter. 3. Chronic lacunar infarct within the left thalamus. 4. Mild generalized cerebral volume loss.  08/17/2023 SLUMS 27/30 Differential diagnosis includes language variant cognitive impairment or early dementia, but current assessment is mild cognitive impairment.   Per chart, pt previously experienced cognitive side effects from an unnspecified antihypertensive, which was discontinued. Blood pressure management is ongoing.   PAIN:  Are you having pain? No   FALLS: Has patient fallen in last 6 months?  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 She also reports knowing the word but it just won't come out but I do remember afterwards. This occurs several times a day, everyday.  AUDITORY COMPREHENSION  Overall auditory comprehension: Appears intact YES/NO questions: Appears intact Following directions: difficulty with multi-step directions d/t memory impairment Conversation: Complex  READING COMPREHENSION: Intact  EXPRESSION: verbal  VERBAL EXPRESSION:   Overall verbal expression: Appears intact Level of generative/spontaneous verbalization: conversation Automatic speech: name: intact and social response: intact  Repetition: Appears intact Naming:  Responsive: 76-100%, Confrontation: 76-100%, Convergent: 76-100%, and Divergent: 76-100% Pragmatics: Appears intact INon-verbal means of communication: N/A  WRITTEN EXPRESSION: Dominant hand:  right Written expression: Appears intact  ORAL MOTOR EXAMINATION Facial : WFL Lingual: WFL Velum: WFL Mandible: WFL Cough: WFL Voice: WFL  MOTOR SPEECH: Overall motor speech: Appears intact Respiration: diaphragmatic/abdominal breathing Phonation: normal Resonance: WFL Articulation: Appears intact Intelligibility: Intelligible Motor planning: Appears intact   STANDARDIZED ASSESSMENTS: The Repeatable Battery for the Assessment of Neuropsychological Status Update (RBANS-Update) is a brief, individually administered test measuring attention, language, visuospatial/constructional abilities, and immediate and delayed memory. The test comprises 12 subtests that are intended for use with adolescents to adults, ages 58 to 51 years. Normative scores were developed using a stratified, nationally representative sample of 690 healthy adolescents and adults. RBANS yields index standard scores based on subtest raw scores. RBANS index scores are metrically scaled, with a mean of 100 and a standard deviation of 15 for each age group.    Immediate Memory Index Score: 87: Low Average (Index Score 80-89)  Total Domain Score List Learning 22/40 Story Memory 16/24  Visuospatial/Constructional Index Score: 121: Superior (Index Score 120-129)  Total Domain Score Figure Copy 20/20 Line Orientation 18/20   Language Index Score: 83: Low Average (Index Score 80-89)  Total Domain Score Picture Naming 7/10 Semantic Fluency 22/40   Attention Index Score: 103: Average (Index Score 90-109)  Total Domain Score Digit Span 10/16  Coding 49/89   Delayed Memory Index Score: 102: Average (Index Score 90-109)  Total Domain Score List Recall 4/10 List Recognition 20/20  Story Recall 6/12 Figure Recall 20/20   Total Scale is 98   PATIENT REPORTED OUTCOME MEASURES (PROM): To be completed over the next 3 sessions   TODAY'S TREATMENT:  N/A   PATIENT EDUCATION: Education details: ST  POC Person educated: Patient Education method: Explanation Education comprehension: needs further education   HOME EXERCISE PROGRAM:   N/A   GOALS:  Goals reviewed with patient? Yes  SHORT TERM GOALS: Target date: 10 sessions  Pt. will recall 3/4 memory strategies (w-write it, r-repeat, a-associate it, p-picture) and utilize strategies in structured and functional memory tasks to recall 80% of presented information given min cues.  Baseline: Goal status: INITIAL  2.  Pt will complete word generation tasks to address lexical retrieval given 90% accuracy given minimal cues.   Baseline:  Goal status: INITIAL  3.   With Mod I to Supervision A, patient will recall functional verbal information 90% accuracy with use of compensations/strategies (repeats, rephrasing, notetaking, etc).  Baseline:  Goal status: INITIAL   LONG TERM GOALS: Target date: 02/02/2024   With Mod I, patient will use strategies (ie., white board, daily planner/calendar, Apps on phone) to improve memory for important information with 90% accuracy.  Baseline:  Goal status: INITIAL  2.  With Mod I, patient will demonstrate understanding of functional cognitive activities by listing 2 activities for home maintenance program. Baseline:  Goal status: INITIAL  3.  Patient will report engagement in cognitive activities outside of ST for 5/7 days.  Baseline:  Goal status: INITIAL   ASSESSMENT:  CLINICAL IMPRESSION: Patient is a 63 y.o. female who was seen today for a cognitive communication evaluation d/t mild cognitive impairment.   OBJECTIVE IMPAIRMENTS include memory and expressive language. These impairments are limiting patient from managing medications, managing appointments, household responsibilities, ADLs/IADLs, and effectively communicating at home and in community. Factors affecting potential to achieve goals and functional outcome are N/A. Patient will  benefit from skilled SLP services to address  above impairments and improve overall function.  REHAB POTENTIAL: Good  PLAN: SLP FREQUENCY: 1-2x/week  SLP DURATION: 8 weeks  PLANNED INTERVENTIONS: Internal/external aids, Functional tasks, SLP instruction and feedback, Compensatory strategies, and Patient/family education   Idaliz Tinkle B. Rubbie, M.S., CCC-SLP, Tree surgeon Certified Brain Injury Specialist [facility]  [facility] Rehabilitation Services Office [phone] Ascom [phone] Fax [phone]

## 2023-12-14 ENCOUNTER — Ambulatory Visit: Attending: Internal Medicine | Admitting: Speech Pathology

## 2023-12-14 DIAGNOSIS — R4701 Aphasia: Secondary | ICD-10-CM | POA: Diagnosis present

## 2023-12-14 DIAGNOSIS — R41841 Cognitive communication deficit: Secondary | ICD-10-CM | POA: Insufficient documentation

## 2023-12-14 NOTE — Therapy (Signed)
 OUTPATIENT SPEECH LANGUAGE PATHOLOGY  COGNITION COMMUNICATION TREATMENT NOTE   Patient Name: Julie Cantu MRN: [id number] DOB:04-30-1961, 63 y.o., female Today's Date: 12/14/2023  PCP: Oneil Pinal, MD REFERRING PROVIDER: Jannett Fairly, MD   End of Session - 12/14/23 1356     Visit Number 2    Number of Visits 17    Date for SLP Re-Evaluation 02/03/24    Authorization Type [employer]etna/[employer]    Progress Note Due on Visit 10    SLP Start Time 1315    SLP Stop Time  1400    SLP Time Calculation (min) 45 min    Activity Tolerance Patient tolerated treatment well          Past Medical History:  Diagnosis Date   Dizziness    Headache    Hypertension    Past Surgical History:  Procedure Laterality Date   arm surgery Right    BREAST SURGERY Right 08/2013   right breast core    CHOLECYSTECTOMY  2000   Patient Active Problem List   Diagnosis Date Noted   Rash 09/04/2013    ONSET DATE: symptoms started mid to late 2024; date of referral 11/19/2023  REFERRING DIAG: G31.84 (ICD-10-CM) - Mild cognitive impairment   THERAPY DIAG:  Cognitive communication deficit  Aphasia  Rationale for Evaluation and Treatment Rehabilitation  SUBJECTIVE:   PERTINENT HISTORY and DIAGNOSTIC FINDINGS: Pt is a 63 year old female with medical history of fatting liver, obstructive sleep apnea, hypertension, migraine and cognitive issues since mid to late 2024, including fogginess, word-finding difficulty and memory problems. Dsylexia may conribute to these challenges as well as low Vit B12. Per chart Negative autoimmune dementia labs. Early onset Alzheimer's Disease genetic panel was negative. Patient may need to get [id number] drawn again at next visit (with neurologist).   MRI 04/26/2023 1. No evidence of an acute intracranial abnormality. 2. Mild chronic small vessel ischemic changes within the cerebral white matter. 3. Chronic lacunar infarct within the left thalamus. 4.  Mild generalized cerebral volume loss.  08/17/2023 SLUMS 27/30 Differential diagnosis includes language variant cognitive impairment or early dementia, but current assessment is mild cognitive impairment.   Per chart, pt previously experienced cognitive side effects from an unnspecified antihypertensive, which was discontinued. Blood pressure management is ongoing.   PAIN:  Are you having pain? No   FALLS: Has patient fallen in last 6 months?  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 SLP facilitated session by providing the following interventions:  Information provided on neuropsychological testing, contact information shared with pt on previously mentioned referral found in neurology's note  Communication strategies provided (written and verbal information): Use description (semantic feature analysis) Write down word Draw object Alternate word   Memory strategies Repetition Write it down Associate   PATIENT REPORTED OUTCOME MEASURES (PROM): To be completed over the next 3 sessions  PATIENT EDUCATION: Education details: see above Person educated: Patient Education method: Explanation Education comprehension: needs further education   HOME EXERCISE PROGRAM:   Compensatory word finding strategies  Memory strategies   GOALS:  Goals reviewed with patient?  Yes  SHORT TERM GOALS: Target date: 10 sessions  Pt. will recall 3/4 memory strategies (w-write it, r-repeat, a-associate it, p-picture) and utilize strategies in  structured and functional memory tasks to recall 80% of presented information given min cues.  Baseline: Goal status: INITIAL  2.  Pt will complete word generation tasks to address lexical retrieval given 90% accuracy given minimal cues.   Baseline:  Goal status: INITIAL  3.   With Mod I to Supervision A, patient will recall functional verbal information 90% accuracy with use of compensations/strategies (repeats, rephrasing, notetaking, etc).  Baseline:  Goal status: INITIAL   LONG TERM GOALS: Target date: 02/02/2024   With Mod I, patient will use strategies (ie., white board, daily planner/calendar, Apps on phone) to improve memory for important information with 90% accuracy.  Baseline:  Goal status: INITIAL  2.  With Mod I, patient will demonstrate understanding of functional cognitive activities by listing 2 activities for home maintenance program. Baseline:  Goal status: INITIAL  3.  Patient will report engagement in cognitive activities outside of ST for 5/7 days.  Baseline:  Goal status: INITIAL   ASSESSMENT:  CLINICAL IMPRESSION: Patient is a 63 y.o. female who was seen today for a cognitive communication treatment session d/t mild cognitive impairment.   Pt provides additional information about word finding deficits to include the word is not there in any language. She can intermittently picture the object that she is trying to name. She remained eager throughout the session to implement the strategies mentioned above - see skilled treatment note for details.   OBJECTIVE IMPAIRMENTS include memory and expressive language. These impairments are limiting patient from managing medications, managing appointments, household responsibilities, ADLs/IADLs, and effectively communicating at home and in community. Factors affecting potential to achieve goals and functional outcome are N/A. Patient will benefit from skilled SLP services to address above impairments and improve  overall function.  REHAB POTENTIAL: Good  PLAN: SLP FREQUENCY: 1-2x/week  SLP DURATION: 8 weeks  PLANNED INTERVENTIONS: Internal/external aids, Functional tasks, SLP instruction and feedback, Compensatory strategies, and Patient/family education   Nanea Jared B. Rubbie, M.S., CCC-SLP, Tree surgeon Certified Brain Injury Specialist [facility]  [facility] Rehabilitation Services Office [phone] Ascom [phone] Fax [phone]

## 2023-12-16 ENCOUNTER — Ambulatory Visit: Admitting: Speech Pathology

## 2023-12-16 DIAGNOSIS — R41841 Cognitive communication deficit: Secondary | ICD-10-CM | POA: Diagnosis not present

## 2023-12-16 DIAGNOSIS — R4701 Aphasia: Secondary | ICD-10-CM

## 2023-12-16 NOTE — Therapy (Signed)
 OUTPATIENT SPEECH LANGUAGE PATHOLOGY  COGNITION COMMUNICATION TREATMENT NOTE   Patient Name: Julie Cantu MRN: [id number] DOB:1961-04-10, 63 y.o., female Today's Date: 12/16/2023  PCP: Oneil Pinal, MD REFERRING PROVIDER: Jannett Fairly, MD   End of Session - 12/16/23 0932     Visit Number 3    Number of Visits 17    Date for SLP Re-Evaluation 02/03/24    Authorization Type Aetna/[employer]    Authorization - Visit Number 3    Authorization - Number of Visits 30    Progress Note Due on Visit 10    SLP Start Time 0935    SLP Stop Time  1015    SLP Time Calculation (min) 40 min    Activity Tolerance Patient tolerated treatment well          Past Medical History:  Diagnosis Date   Dizziness    Headache    Hypertension    Past Surgical History:  Procedure Laterality Date   arm surgery Right    BREAST SURGERY Right 08/2013   right breast core    CHOLECYSTECTOMY  2000   Patient Active Problem List   Diagnosis Date Noted   Rash 09/04/2013    ONSET DATE: symptoms started mid to late 2024; date of referral 11/19/2023  REFERRING DIAG: G31.84 (ICD-10-CM) - Mild cognitive impairment   THERAPY DIAG:  Cognitive communication deficit  Aphasia  Rationale for Evaluation and Treatment Rehabilitation  SUBJECTIVE:   PERTINENT HISTORY and DIAGNOSTIC FINDINGS: Pt is a 63 year old female with medical history of fatting liver, obstructive sleep apnea, hypertension, migraine and cognitive issues since mid to late 2024, including fogginess, word-finding difficulty and memory problems. Dsylexia may conribute to these challenges as well as low Vit B12. Per chart Negative autoimmune dementia labs. Early onset Alzheimer's Disease genetic panel was negative. Patient may need to get [id number] drawn again at next visit (with neurologist).   MRI 04/26/2023 1. No evidence of an acute intracranial abnormality. 2. Mild chronic small vessel ischemic changes within the  cerebral white matter. 3. Chronic lacunar infarct within the left thalamus. 4. Mild generalized cerebral volume loss.  08/17/2023 SLUMS 27/30 Differential diagnosis includes language variant cognitive impairment or early dementia, but current assessment is mild cognitive impairment.   Per chart, pt previously experienced cognitive side effects from an unnspecified antihypertensive, which was discontinued. Blood pressure management is ongoing.   PAIN:  Are you having pain? No   FALLS: Has patient fallen in last 6 months?  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 SLP facilitated session by providing the following interventions:  My husband hurt his back sometimes you have to leave yourself behind because of life's activities  Pt reports using an agenda (journal) but is only writing down information for herself - recommend she write down additional events - anything that she might want to talk about (such as the day that her husband hurt his back) she needs to write this down  Pt was Mod I for use of word finding strategies during a semi-complex verbal description task for unknown information to achieve > 95% intelligibility  Recommend using scripts for phone calls such as calling [employer] for a refill  PATIENT REPORTED OUTCOME MEASURES (PROM): To be completed over the next 3 sessions  PATIENT EDUCATION: Education details: see above Person educated: Patient Education method: Explanation Education comprehension: needs further education   HOME EXERCISE  PROGRAM:   Compensatory word finding strategies  Memory strategies   GOALS:  Goals reviewed with patient? Yes  SHORT TERM GOALS: Target date: 10 sessions  Pt. will recall 3/4 memory strategies (w-write it, r-repeat, a-associate it, p-picture) and utilize strategies in structured and functional memory tasks to recall 80% of presented information given min cues.  Baseline: Goal status: INITIAL  2.  Pt will complete word generation tasks to address lexical retrieval given 90% accuracy given minimal cues.   Baseline:  Goal status: INITIAL  3.   With Mod I to Supervision A, patient will recall functional verbal information 90% accuracy with use of compensations/strategies (repeats, rephrasing, notetaking, etc).  Baseline:  Goal status: INITIAL   LONG TERM GOALS: Target date: 02/02/2024   With Mod I, patient will use strategies (ie., white board, daily planner/calendar, Apps on phone) to improve memory for important information with 90% accuracy.  Baseline:  Goal status: INITIAL  2.  With Mod I, patient will demonstrate understanding of functional cognitive activities by listing 2 activities for home maintenance program. Baseline:  Goal status: INITIAL  3.  Patient will report engagement in cognitive activities outside of ST for 5/7 days.  Baseline:  Goal status: INITIAL   ASSESSMENT:  CLINICAL IMPRESSION: Patient is a 63 y.o. female who was seen today for a cognitive communication treatment session d/t mild cognitive impairment.   Pt provides additional information about word finding deficits to include the word is not there in any language. She can intermittently picture the object that she is trying to name. She remained eager throughout the session to implement the strategies mentioned above - see skilled treatment note for details.   OBJECTIVE IMPAIRMENTS include memory and expressive language. These impairments are limiting patient from managing medications, managing  appointments, household responsibilities, ADLs/IADLs, and effectively communicating at home and in community. Factors affecting potential to achieve goals and functional outcome are N/A. Patient will benefit from skilled SLP services to address above impairments and improve overall function.  REHAB POTENTIAL: Good  PLAN: SLP FREQUENCY: 1-2x/week  SLP DURATION: 8 weeks  PLANNED INTERVENTIONS: Internal/external aids, Functional tasks, SLP instruction and feedback, Compensatory strategies, and Patient/family education   Antania Hoefling B. Rubbie, M.S., CCC-SLP, Tree surgeon Certified Brain Injury Specialist [facility]  [facility] Rehabilitation Services Office [phone] Ascom [phone] Fax [phone]

## 2023-12-20 ENCOUNTER — Other Ambulatory Visit: Payer: Self-pay | Admitting: Gastroenterology

## 2023-12-20 DIAGNOSIS — K760 Fatty (change of) liver, not elsewhere classified: Secondary | ICD-10-CM

## 2023-12-28 ENCOUNTER — Ambulatory Visit
Admission: RE | Admit: 2023-12-28 | Discharge: 2023-12-28 | Disposition: A | Discharge status: Home / Self Care | Source: Ambulatory Visit | Attending: Gastroenterology | Admitting: Gastroenterology

## 2023-12-28 DIAGNOSIS — K760 Fatty (change of) liver, not elsewhere classified: Secondary | ICD-10-CM | POA: Diagnosis present

## 2024-02-09 ENCOUNTER — Encounter: Payer: Self-pay | Admitting: Gastroenterology

## 2024-02-09 ENCOUNTER — Ambulatory Visit: Admitting: Certified Registered Nurse Anesthetist

## 2024-02-09 ENCOUNTER — Encounter
Admission: RE | Disposition: A | Discharge status: Home / Self Care | Payer: Self-pay | Source: Home / Self Care | Attending: Gastroenterology

## 2024-02-09 ENCOUNTER — Ambulatory Visit
Admission: RE | Admit: 2024-02-09 | Discharge: 2024-02-09 | Disposition: A | Discharge status: Home / Self Care | Attending: Gastroenterology | Admitting: Gastroenterology

## 2024-02-09 ENCOUNTER — Other Ambulatory Visit: Payer: Self-pay

## 2024-02-09 DIAGNOSIS — Z79899 Other long term (current) drug therapy: Secondary | ICD-10-CM | POA: Insufficient documentation

## 2024-02-09 DIAGNOSIS — K449 Diaphragmatic hernia without obstruction or gangrene: Secondary | ICD-10-CM | POA: Insufficient documentation

## 2024-02-09 DIAGNOSIS — K2970 Gastritis, unspecified, without bleeding: Secondary | ICD-10-CM | POA: Insufficient documentation

## 2024-02-09 DIAGNOSIS — D120 Benign neoplasm of cecum: Secondary | ICD-10-CM | POA: Insufficient documentation

## 2024-02-09 DIAGNOSIS — R1011 Right upper quadrant pain: Secondary | ICD-10-CM | POA: Insufficient documentation

## 2024-02-09 DIAGNOSIS — Z1211 Encounter for screening for malignant neoplasm of colon: Secondary | ICD-10-CM | POA: Diagnosis present

## 2024-02-09 DIAGNOSIS — K2100 Gastro-esophageal reflux disease with esophagitis, without bleeding: Secondary | ICD-10-CM | POA: Diagnosis not present

## 2024-02-09 DIAGNOSIS — R519 Headache, unspecified: Secondary | ICD-10-CM | POA: Diagnosis not present

## 2024-02-09 DIAGNOSIS — I10 Essential (primary) hypertension: Secondary | ICD-10-CM | POA: Insufficient documentation

## 2024-02-09 HISTORY — PX: ESOPHAGOGASTRODUODENOSCOPY: SHX5428

## 2024-02-09 HISTORY — PX: COLONOSCOPY: SHX5424

## 2024-02-09 HISTORY — PX: POLYPECTOMY: SHX149

## 2024-02-09 SURGERY — COLONOSCOPY
Anesthesia: General

## 2024-02-09 MED ORDER — PROPOFOL 500 MG/50ML IV EMUL
INTRAVENOUS | Status: DC | PRN
Start: 2024-02-09 — End: 2024-02-09
  Administered 2024-02-09: 150 ug/kg/min via INTRAVENOUS

## 2024-02-09 MED ORDER — PROPOFOL 10 MG/ML IV BOLUS
INTRAVENOUS | Status: DC | PRN
  Administered 2024-02-09: 20 mg via INTRAVENOUS
  Administered 2024-02-09: 50 mg via INTRAVENOUS

## 2024-02-09 MED ORDER — LIDOCAINE HCL (CARDIAC) PF 100 MG/5ML IV SOSY
PREFILLED_SYRINGE | INTRAVENOUS | Status: DC | PRN
  Administered 2024-02-09: 50 mg via INTRAVENOUS

## 2024-02-09 MED ORDER — SODIUM CHLORIDE 0.9 % IV SOLN: INTRAVENOUS | Status: DC

## 2024-02-09 NOTE — Op Note (Signed)
 [facility] Patient Name: Julie Cantu Procedure Date: 02/09/2024 10:13 AM MRN: [id number] Account #: [id number] Date of Birth: 1960/11/30 Admit Type: Outpatient Age: 63 Room: [facility] ENDO ROOM 1 Gender: Female Note Status: Finalized Instrument Name: Colon Scope [id number] Procedure:             Colonoscopy Indications:           Screening for colorectal malignant neoplasm, Last                         colonoscopy: October 2010 Providers:             Corinn Jess Brooklyn MD, MD Referring MD:          Oneil PHEBE Pinal, MD (Referring MD) Medicines:             General Anesthesia Complications:         No immediate complications. Estimated blood loss: None. Procedure:             Pre-Anesthesia Assessment:                        - Prior to the procedure, a History and Physical was                         performed, and patient medications and allergies were                         reviewed. The patient is competent. The risks and                         benefits of the procedure and the sedation options and                         risks were discussed with the patient. All questions                         were answered and informed consent was obtained.                         Patient identification and proposed procedure were                         verified by the physician, the nurse, the                         anesthesiologist, the anesthetist and the technician                         in the pre-procedure area in the procedure room in the                         endoscopy suite. Mental Status Examination: alert and                         oriented. Airway Examination: normal oropharyngeal                         airway and neck mobility. Respiratory Examination:  clear to auscultation. CV Examination: normal.                         Prophylactic Antibiotics: The patient does not require                          prophylactic antibiotics. Prior Anticoagulants: The                         patient has taken no anticoagulant or antiplatelet                         agents. ASA Grade Assessment: II - A patient with mild                         systemic disease. After reviewing the risks and                         benefits, the patient was deemed in satisfactory                         condition to undergo the procedure. The anesthesia                         plan was to use general anesthesia. Immediately prior                         to administration of medications, the patient was                         re-assessed for adequacy to receive sedatives. The                         heart rate, respiratory rate, oxygen saturations,                         blood pressure, adequacy of pulmonary ventilation, and                         response to care were monitored throughout the                         procedure. The physical status of the patient was                         re-assessed after the procedure.                        After obtaining informed consent, the colonoscope was                         passed under direct vision. Throughout the procedure,                         the patient's blood pressure, pulse, and oxygen                         saturations were monitored continuously. The  Colonoscope was introduced through the anus and                         advanced to the the cecum, identified by appendiceal                         orifice and ileocecal valve. The colonoscopy was                         performed without difficulty. The patient tolerated                         the procedure well. The quality of the bowel                         preparation was evaluated using the BBPS [facility] Bowel                         Preparation Scale) with scores of: Right Colon = 3,                         Transverse Colon = 3 and Left Colon = 3 (entire mucosa                          seen well with no residual staining, small fragments                         of stool or opaque liquid). The total BBPS score                         equals 9. The ileocecal valve, appendiceal orifice,                         and rectum were photographed. Findings:      The perianal and digital rectal examinations were normal. Pertinent       negatives include normal sphincter tone and no palpable rectal lesions.      A 6 mm polyp was found in the cecum. The polyp was sessile. The polyp       was removed with a cold snare. Resection and retrieval were complete.       Estimated blood loss: none.      The retroflexed view of the distal rectum and anal verge was normal and       showed no anal or rectal abnormalities. Impression:            - One 6 mm polyp in the cecum, removed with a cold                         snare. Resected and retrieved.                        - The distal rectum and anal verge are normal on                         retroflexion view. Recommendation:        - Discharge patient to home (with escort).                        -  Resume previous diet today.                        - Continue present medications.                        - Await pathology results.                        - Repeat colonoscopy in 5 years for surveillance. Procedure Code(s):     --- Professional ---                        [id number], Colonoscopy, flexible; with removal of                         tumor(s), polyp(s), or other lesion(s) by snare                         technique Diagnosis Code(s):     --- Professional ---                        Z12.11, Encounter for screening for malignant neoplasm                         of colon                        D12.0, Benign neoplasm of cecum CPT copyright 2022 American Medical Association. All rights reserved. The codes documented in this report are preliminary and upon coder review may  be revised to meet current compliance requirements. Dr. [name] Corinn Jess Brooklyn MD, MD 02/09/2024 10:53:22 AM This report has been signed electronically. Number of Addenda: 0 Note Initiated On: 02/09/2024 10:13 AM Scope Withdrawal Time: 0 hours 8 minutes 8 seconds  Total Procedure Duration: 0 hours 10 minutes 25 seconds  Estimated Blood Loss:  Estimated blood loss: none.      [facility]

## 2024-02-09 NOTE — Anesthesia Procedure Notes (Signed)
 Procedure Name: MAC Date/Time: 02/09/2024 10:23 AM  Performed by: Delores Evalene BROCKS, CRNAPre-anesthesia Checklist: Patient identified, Emergency Drugs available, Suction available and Patient being monitored Patient Re-evaluated:Patient Re-evaluated prior to induction Oxygen Delivery Method: Nasal cannula Induction Type: IV induction Placement Confirmation: positive ETCO2

## 2024-02-09 NOTE — H&P (Signed)
 Julie JONELLE Brooklyn, MD [facility], DHIP [address]  [place], [place] [address]  Main: [phone] Fax:  [phone] Pager: [phone]   Primary Care Physician:  Cleotilde Oneil FALCON, MD Primary Gastroenterologist:  Dr. Corinn JONELLE Cantu  Pre-Procedure History & Physical: HPI:  Julie Cantu is a 63 y.o. female is here for an endoscopy and colonoscopy.   Past Medical History:  Diagnosis Date   Dizziness    Headache    Hypertension     Past Surgical History:  Procedure Laterality Date   arm surgery Right    BREAST SURGERY Right 08/2013   right breast core    CHOLECYSTECTOMY  2000    Prior to Admission medications   Medication Sig Start Date End Date Taking? Authorizing Provider  propranolol (INDERAL) 40 MG tablet Take 40 mg by mouth 2 (two) times daily.    Yes [provider]  clobetasol  cream (TEMOVATE ) 0.05 % Apply 1 application topically 2 (two) times daily. 09/06/13   Dessa Reyes ORN, MD  cloNIDine  (CATAPRES ) 0.1 MG tablet Take 1 tablet (0.1 mg total) by mouth 2 (two) times daily as needed (hypertension with symptoms). 10/10/21 10/10/22  Angelena Smalls, MD  ibuprofen (ADVIL,MOTRIN) 200 MG tablet Take 200 mg by mouth every 6 (six) hours as needed.    [provider]  lisinopril (PRINIVIL,ZESTRIL) 10 MG tablet Take 10 mg by mouth as needed.     [provider]  Nutritional Supplements (ESTROVEN ENERGY PO) Take by mouth.    [provider]  verapamil (CALAN) 120 MG tablet Take 120 mg by mouth as needed.     [provider]    Allergies as of 12/16/2023 - Review Complete 10/10/2021  Allergen Reaction Noted   Ivp dye [iodinated contrast media] Rash 09/04/2013    Family History  Problem Relation Age of Onset   Breast cancer Neg Hx     Social History   Socioeconomic History   Marital status: Married    Spouse name: Not on file   Number of children: Not on file   Years of education:  Not on file   Highest education level: Not on file  Occupational History   Not on file  Tobacco Use   Smoking status: Never   Smokeless tobacco: Never  Vaping Use   Vaping status: Not on file  Substance and Sexual Activity   Alcohol use: No   Drug use: Never   Sexual activity: Not on file  Other Topics Concern   Not on file  Social History Narrative   Not on file   Social Drivers of Health   Financial Resource Strain: Low Risk  (12/16/2023)   Received from [facility]   Overall Financial Resource Strain (CARDIA)    Difficulty of Paying Living Expenses: Not hard at all  Food Insecurity: No Food Insecurity (12/16/2023)   Received from [facility]   Hunger Vital Sign    Within the past 12 months, you worried that your food would run out before you got the money to buy more.: Never true    Within the past 12 months, the food you bought just didn't last and you didn't have money to get more.: Never true  Transportation Needs: No Transportation Needs (12/16/2023)   Received from [facility]    In the past 12 months, has lack of transportation kept you from medical appointments or from getting medications?: No  Lack of Transportation (Non-Medical): No  Physical Activity: Not on file  Stress: Not on file  Social Connections: Not on file  Intimate Partner Violence: Not on file    Review of Systems: See HPI, otherwise negative ROS  Physical Exam: BP (!) 143/67   Pulse 65   Temp (!) 97.2 F (36.2 C) (Tympanic)   Resp 18   Ht 5' 1 (1.549 m)   Wt 68.6 kg   SpO2 99%   BMI 28.57 kg/m  General:   Alert,  pleasant and cooperative in NAD Head:  Normocephalic and atraumatic. Neck:  Supple; no masses or thyromegaly. Lungs:  Clear throughout to auscultation.    Heart:  Regular rate and rhythm. Abdomen:  Soft, nontender and nondistended. Normal bowel sounds, without guarding, and without rebound.    Neurologic:  Alert and  oriented x4;  grossly normal neurologically.  Impression/Plan: Julie Cantu is here for an endoscopy and colonoscopy to be performed for Chronic right upper quadrant pain, Colon cancer screening   Risks, benefits, limitations, and alternatives regarding  endoscopy and colonoscopy have been reviewed with the patient.  Questions have been answered.  All parties agreeable.   Julie Brooklyn, MD  02/09/2024, 9:46 AM

## 2024-02-09 NOTE — Transfer of Care (Signed)
 Immediate Anesthesia Transfer of Care Note  Patient: Julie Cantu  Procedure(s) Performed: COLONOSCOPY EGD (ESOPHAGOGASTRODUODENOSCOPY) POLYPECTOMY, INTESTINE  Patient Location: PACU  Anesthesia Type:General  Level of Consciousness: drowsy  Airway & Oxygen Therapy: Patient Spontanous Breathing  Post-op Assessment: Report given to RN and Post -op Vital signs reviewed and stable  Post vital signs: Reviewed and stable  Last Vitals:  Vitals Value Taken Time  BP 95/48 02/09/24 10:53  Temp 36.1 C 02/09/24 10:52  Pulse 64 02/09/24 10:53  Resp 12 02/09/24 10:53  SpO2 99 % 02/09/24 10:53  Vitals shown include unfiled device data.  Last Pain:  Vitals:   02/09/24 1052  TempSrc: Temporal  PainSc:          Complications: No notable events documented.

## 2024-02-09 NOTE — Anesthesia Preprocedure Evaluation (Signed)
 Anesthesia Evaluation  Patient identified by MRN, date of birth, ID band Patient awake    Reviewed: Allergy & Precautions, H&P , NPO status , Patient's Chart, lab work & pertinent test results, reviewed documented beta blocker date and time   Airway Mallampati: II   Neck ROM: full    Dental  (+) Poor Dentition   Pulmonary neg pulmonary ROS   Pulmonary exam normal        Cardiovascular Exercise Tolerance: Poor hypertension, On Medications negative cardio ROS Normal cardiovascular exam Rhythm:regular Rate:Normal     Neuro/Psych  Headaches  negative psych ROS   GI/Hepatic negative GI ROS, Neg liver ROS,,,  Endo/Other  negative endocrine ROS    Renal/GU negative Renal ROS  negative genitourinary   Musculoskeletal   Abdominal   Peds  Hematology negative hematology ROS (+)   Anesthesia Other Findings Past Medical History: No date: Dizziness No date: Headache No date: Hypertension Past Surgical History: No date: arm surgery; Right 08/2013: BREAST SURGERY; Right     Comment:  right breast core  2000: CHOLECYSTECTOMY BMI    Body Mass Index: 28.57 kg/m     Reproductive/Obstetrics negative OB ROS                              Anesthesia Physical Anesthesia Plan  ASA: 2  Anesthesia Plan: General   Post-op Pain Management:    Induction:   PONV Risk Score and Plan:   Airway Management Planned:   Additional Equipment:   Intra-op Plan:   Post-operative Plan:   Informed Consent: I have reviewed the patients History and Physical, chart, labs and discussed the procedure including the risks, benefits and alternatives for the proposed anesthesia with the patient or authorized representative who has indicated his/her understanding and acceptance.     Dental Advisory Given  Plan Discussed with: CRNA  Anesthesia Plan Comments:         Anesthesia Quick Evaluation

## 2024-02-09 NOTE — Op Note (Signed)
 [facility]astroenterology Patient Name: Julie Cantu Procedure Date: 02/09/2024 10:17 AM MRN: [id number] Account #: [id number] Date of Birth: Sep 08, 1960 Admit Type: Outpatient Age: 63 Room: [facility] ENDO ROOM 1 Gender: Female Note Status: Finalized Instrument Name: Upper GI Scope [id number] Procedure:             Upper GI endoscopy Indications:           Abdominal pain in the right upper quadrant Providers:             Corinn Jess Brooklyn MD, MD Medicines:             General Anesthesia Complications:         No immediate complications. Estimated blood loss: None. Procedure:             Pre-Anesthesia Assessment:                        - Prior to the procedure, a History and Physical was                         performed, and patient medications and allergies were                         reviewed. The patient is competent. The risks and                         benefits of the procedure and the sedation options and                         risks were discussed with the patient. All questions                         were answered and informed consent was obtained.                         Patient identification and proposed procedure were                         verified by the physician, the nurse, the                         anesthesiologist, the anesthetist and the technician                         in the pre-procedure area in the procedure room in the                         endoscopy suite. Mental Status Examination: alert and                         oriented. Airway Examination: normal oropharyngeal                         airway and neck mobility. Respiratory Examination:                         clear to auscultation. CV Examination: normal.  Prophylactic Antibiotics: The patient does not require                         prophylactic antibiotics. Prior Anticoagulants: The                         patient has taken no anticoagulant  or antiplatelet                         agents. ASA Grade Assessment: III - A patient with                         severe systemic disease. After reviewing the risks and                         benefits, the patient was deemed in satisfactory                         condition to undergo the procedure. The anesthesia                         plan was to use general anesthesia. Immediately prior                         to administration of medications, the patient was                         re-assessed for adequacy to receive sedatives. The                         heart rate, respiratory rate, oxygen saturations,                         blood pressure, adequacy of pulmonary ventilation, and                         response to care were monitored throughout the                         procedure. The physical status of the patient was                         re-assessed after the procedure.                        After obtaining informed consent, the endoscope was                         passed under direct vision. Throughout the procedure,                         the patient's blood pressure, pulse, and oxygen                         saturations were monitored continuously. The Endoscope                         was introduced through the mouth, and advanced to the  second part of duodenum. The upper GI endoscopy was                         accomplished without difficulty. The patient tolerated                         the procedure well. Findings:      The duodenal bulb and second portion of the duodenum were normal.       Biopsies were taken with a cold forceps for histology.      The entire examined stomach was normal. Biopsies were taken with a cold       forceps for histology.      A small hiatal hernia was present.      Esophagogastric landmarks were identified: the gastroesophageal junction       was found at 35 cm from the incisors.      LA Grade A (one or more  mucosal breaks less than 5 mm, not extending       between tops of 2 mucosal folds) esophagitis with no bleeding was found       at the gastroesophageal junction.      The examined esophagus was normal. Impression:            - Normal duodenal bulb and second portion of the                         duodenum. Biopsied.                        - Normal stomach. Biopsied.                        - Small hiatal hernia.                        - Esophagogastric landmarks identified.                        - LA Grade A reflux esophagitis with no bleeding.                        - Normal esophagus. Recommendation:        - Await pathology results.                        - Follow an antireflux regimen indefinitely.                        - Use a proton pump inhibitor PO daily for 3 months.                        - Proceed with colonoscopy as scheduled                        See colonoscopy report Procedure Code(s):     --- Professional ---                        [phone], Esophagogastroduodenoscopy, flexible,                         transoral; with biopsy, single or multiple  Diagnosis Code(s):     --- Professional ---                        K44.9, Diaphragmatic hernia without obstruction or                         gangrene                        K21.00, Gastro-esophageal reflux disease with                         esophagitis, without bleeding                        R10.11, Right upper quadrant pain CPT copyright 2022 American Medical Association. All rights reserved. The codes documented in this report are preliminary and upon coder review may  be revised to meet current compliance requirements. Dr. [name] Corinn Jess Brooklyn MD, MD 02/09/2024 10:37:01 AM This report has been signed electronically. Number of Addenda: 0 Note Initiated On: 02/09/2024 10:17 AM Estimated Blood Loss:  Estimated blood loss: none.      [facility]

## 2024-02-09 NOTE — Anesthesia Postprocedure Evaluation (Signed)
 Anesthesia Post Note  Patient: Julie Cantu  Procedure(s) Performed: COLONOSCOPY EGD (ESOPHAGOGASTRODUODENOSCOPY) POLYPECTOMY, INTESTINE  Patient location during evaluation: PACU Anesthesia Type: General Level of consciousness: awake and alert Pain management: pain level controlled Vital Signs Assessment: post-procedure vital signs reviewed and stable Respiratory status: spontaneous breathing, nonlabored ventilation, respiratory function stable and patient connected to nasal cannula oxygen Cardiovascular status: blood pressure returned to baseline and stable Postop Assessment: no apparent nausea or vomiting Anesthetic complications: no   No notable events documented.   Last Vitals:  Vitals:   02/09/24 0936  BP: (!) 143/67  Pulse: 65  Resp: 18  Temp: (!) 36.2 C  SpO2: 99%    Last Pain:  Vitals:   02/09/24 0936  TempSrc: Tympanic  PainSc: 6                  Lynwood KANDICE Clause

## 2024-02-10 LAB — SURGICAL PATHOLOGY

## 2024-02-11 ENCOUNTER — Ambulatory Visit: Payer: Self-pay | Admitting: Gastroenterology

## 2024-05-24 ENCOUNTER — Ambulatory Visit: Payer: Self-pay | Admitting: Surgery

## 2024-05-24 NOTE — H&P (Signed)
 Subjective:   CC: Scalp cyst [L72.9]  HPI:  referred by Selinda Azalee Quan, * for evaluation of above.   History of Present Illness     Past Medical History:  has a past medical history of Asthma without status asthmaticus (HHS-HCC), Benign essential hypertension (03/26/2016), Essential hypertension, benign, Headache(784.0), History of stroke, Intermediate coronary syndrome (CMS/HHS-HCC), Lumbago, Mitral valve disorders(424.0), Other and unspecified angina pectoris (), Other and unspecified ovarian cyst, Pleurisy, Unspecified essential hypertension, Unspecified sleep apnea, and Unspecified symptom associated with female genital organs.  Past Surgical History:  has a past surgical history that includes Cholecystectomy; Exploratory laparotomy; Colonoscopy (03/15/2009); Bladder surgery (1998); Breast biopsy (2015); Colon @ [facility] (02/09/2024); and EGD @ [facility] (02/09/2024).  Family History: family history includes Anesthesia problems in her father; Colon polyps in her mother and sister; Coronary Artery Disease (Blocked arteries around heart) in her father; High blood pressure (Hypertension) in her father; Rheum arthritis in her mother; Stroke in her father.  Social History:  reports that she has never smoked. She has never been exposed to tobacco smoke. She has never used smokeless tobacco. She reports that she does not drink alcohol and does not use drugs.  Current Medications: has a current medication list which includes the following prescription(s): aspirin, doxazosin, ibuprofen, montelukast, propranolol, sumatriptan, triamcinolone, and xdemvy.  Allergies:  Allergies  Allergen Reactions   Epinephrine Other (See Comments)    This raises her blood pressure.   Amlodipine Headache   Biaxin [Clarithromycin] Unknown   Iodinated Contrast Media Unknown   Other Other (See Comments)    Fast acting anaesthesia . Elevated blood pressure.   Voltaren [Diclofenac Sodium] Unknown    ROS:  A 15  point review of systems was performed and pertinent positives and negatives noted in HPI   Objective:     BP (!) 143/82   Pulse 57   Ht 152.4 cm (5')   Wt 70.8 kg (156 lb)   BMI 30.47 kg/m   Constitutional :  No distress, cooperative, alert  Lymphatics/Throat:  Supple with no lymphadenopathy  Respiratory:  Clear to auscultation bilaterally  Cardiovascular:  Regular rate and rhythm  Gastrointestinal: Soft, non-tender, non-distended, no organomegaly.  Musculoskeletal: Steady gait and movement  Skin: Cool and moist, large marble size scalp cyst on occipital midline, and smaller pea size one in frontal area, left.   Psychiatric: Normal affect, non-agitated, not confused         LABS:  N/a   RADS: N/a  Assessment:      Scalp cyst [L72.9]  Plan:     1. Scalp cyst [L72.9] Discussed surgical excision.  Alternatives include continued observation.  Benefits include possible symptom relief, pathologic evaluation, improved cosmesis. Discussed the risk of surgery including recurrence, chronic pain, post-op infxn, poor cosmesis, poor/delayed wound healing, and possible re-operation to address said risks. The risks of general anesthetic, if used, includes MI, CVA, sudden death or even reaction to anesthetic medications also discussed.  Typical post-op recovery time of 3-5 days with possible activity restrictions were also discussed.  The patient verbalized understanding and all questions were answered to the patient's satisfaction.  2. Patient has elected to proceed with surgical treatment. Procedure will be scheduled. Prone position.  Hold aspirin 5days prior to procedure  labs/images/medications/previous chart entries reviewed personally and relevant changes/updates noted above.

## 2024-05-24 NOTE — H&P (View-Only) (Signed)
 Subjective:   CC: Scalp cyst [L72.9]  HPI:  referred by Selinda Azalee Quan, * for evaluation of above.   History of Present Illness     Past Medical History:  has a past medical history of Asthma without status asthmaticus (HHS-HCC), Benign essential hypertension (03/26/2016), Essential hypertension, benign, Headache(784.0), History of stroke, Intermediate coronary syndrome (CMS/HHS-HCC), Lumbago, Mitral valve disorders(424.0), Other and unspecified angina pectoris (), Other and unspecified ovarian cyst, Pleurisy, Unspecified essential hypertension, Unspecified sleep apnea, and Unspecified symptom associated with female genital organs.  Past Surgical History:  has a past surgical history that includes Cholecystectomy; Exploratory laparotomy; Colonoscopy (03/15/2009); Bladder surgery (1998); Breast biopsy (2015); Colon @ [facility] (02/09/2024); and EGD @ [facility] (02/09/2024).  Family History: family history includes Anesthesia problems in her father; Colon polyps in her mother and sister; Coronary Artery Disease (Blocked arteries around heart) in her father; High blood pressure (Hypertension) in her father; Rheum arthritis in her mother; Stroke in her father.  Social History:  reports that she has never smoked. She has never been exposed to tobacco smoke. She has never used smokeless tobacco. She reports that she does not drink alcohol and does not use drugs.  Current Medications: has a current medication list which includes the following prescription(s): aspirin, doxazosin, ibuprofen, montelukast, propranolol, sumatriptan, triamcinolone, and xdemvy.  Allergies:  Allergies  Allergen Reactions   Epinephrine Other (See Comments)    This raises her blood pressure.   Amlodipine Headache   Biaxin [Clarithromycin] Unknown   Iodinated Contrast Media Unknown   Other Other (See Comments)    Fast acting anaesthesia . Elevated blood pressure.   Voltaren [Diclofenac Sodium] Unknown    ROS:  A 15  point review of systems was performed and pertinent positives and negatives noted in HPI   Objective:     BP (!) 143/82   Pulse 57   Ht 152.4 cm (5')   Wt 70.8 kg (156 lb)   BMI 30.47 kg/m   Constitutional :  No distress, cooperative, alert  Lymphatics/Throat:  Supple with no lymphadenopathy  Respiratory:  Clear to auscultation bilaterally  Cardiovascular:  Regular rate and rhythm  Gastrointestinal: Soft, non-tender, non-distended, no organomegaly.  Musculoskeletal: Steady gait and movement  Skin: Cool and moist, large marble size scalp cyst on occipital midline, and smaller pea size one in frontal area, left.   Psychiatric: Normal affect, non-agitated, not confused         LABS:  N/a   RADS: N/a  Assessment:      Scalp cyst [L72.9]  Plan:     1. Scalp cyst [L72.9] Discussed surgical excision.  Alternatives include continued observation.  Benefits include possible symptom relief, pathologic evaluation, improved cosmesis. Discussed the risk of surgery including recurrence, chronic pain, post-op infxn, poor cosmesis, poor/delayed wound healing, and possible re-operation to address said risks. The risks of general anesthetic, if used, includes MI, CVA, sudden death or even reaction to anesthetic medications also discussed.  Typical post-op recovery time of 3-5 days with possible activity restrictions were also discussed.  The patient verbalized understanding and all questions were answered to the patient's satisfaction.  2. Patient has elected to proceed with surgical treatment. Procedure will be scheduled. Prone position.  Hold aspirin 5days prior to procedure  labs/images/medications/previous chart entries reviewed personally and relevant changes/updates noted above.

## 2024-05-29 ENCOUNTER — Inpatient Hospital Stay
Admission: RE | Admit: 2024-05-29 | Discharge: 2024-05-29 | Disposition: A | Source: Ambulatory Visit | Attending: Surgery

## 2024-05-29 ENCOUNTER — Other Ambulatory Visit: Payer: Self-pay

## 2024-05-29 NOTE — Patient Instructions (Addendum)
 Your procedure is scheduled on: FRIDAY 06/02/24 Report to the Registration Desk on the 1st floor of the [facility]. To find out your arrival time, please call [phone] between 1PM - 3PM on: THURSDAY 06/01/24 If your arrival time is 6:00 am, do not arrive before that time as the [facility] entrance doors do not open until 6:00 am.  REMEMBER: Instructions that are not followed completely may result in serious medical risk, up to and including death; or upon the discretion of your surgeon and anesthesiologist your surgery may need to be rescheduled.  Do not eat food after midnight the night before surgery.  No gum chewing or hard candies.  You may however, drink CLEAR liquids up to 2 hours before you are scheduled to arrive for your surgery. Do not drink anything within 2 hours of your scheduled arrival time.  Clear liquids include: - water  - apple juice without pulp - gatorade (not RED colors) - black coffee or tea (Do NOT add milk or creamers to the coffee or tea) Do NOT drink anything that is not on this list.  One week prior to surgery: Stop Anti-inflammatories (NSAIDS) such as Advil, Aleve, Ibuprofen, Motrin, Naproxen, Naprosyn and Aspirin based products such as Excedrin, Goody's Powder, BC Powder.  Stop ANY OVER THE COUNTER supplements until after surgery.  You may however, continue to take Tylenol  if needed for pain up until the day of surgery.  Continue taking all of your other prescription medications up until the day of surgery.  ON THE DAY OF SURGERY ONLY TAKE THESE MEDICATIONS WITH SIPS OF WATER:  propranolol ER (INDERAL LA)  doxazosin (CARDURA)  Eye drops * if needed  Use inhalers on the day of surgery and bring to the hospital.  No Alcohol for 24 hours before or after surgery.  No Smoking including e-cigarettes for 24 hours before surgery.  No chewable tobacco products for at least 6 hours before surgery.  No nicotine patches on the day of surgery.  Do  not use any recreational drugs for at least a week (preferably 2 weeks) before your surgery.  Please be advised that the combination of cocaine and anesthesia may have negative outcomes, up to and including death. If you test positive for cocaine, your surgery will be cancelled.  On the morning of surgery brush your teeth with toothpaste and water, you may rinse your mouth with mouthwash if you wish. Do not swallow any toothpaste or mouthwash.  Do not wear jewelry, make-up, hairpins, clips or nail polish.  For welded (permanent) jewelry: bracelets, anklets, waist bands, etc.  Please have this removed prior to surgery.  If it is not removed, there is a chance that hospital personnel will need to cut it off on the day of surgery.  Do not wear lotions, powders, or perfumes.   Do not shave body hair from the neck down 48 hours before surgery.  Contact lenses, hearing aids and dentures may not be worn into surgery.  Do not bring valuables to the hospital. [facility] is not responsible for any missing/lost belongings or valuables.   Bring your C-PAP to the hospital in case you may have to spend the night.   Notify your doctor if there is any change in your medical condition (cold, fever, infection).  Wear comfortable clothing (specific to your surgery type) to the hospital.  After surgery, you can help prevent lung complications by doing breathing exercises.  Take deep breaths and cough every 1-2 hours. Your  doctor may order a device called an Incentive Spirometer to help you take deep breaths.  If you are being discharged the day of surgery, you will not be allowed to drive home. You will need a responsible individual to drive you home and stay with you for 24 hours after surgery.   If you are taking public transportation, you will need to have a responsible individual with you.  Please call the Pre-admissions Testing Dept. at [phone] if you have any questions about these  instructions.  Surgery Visitation Policy:  Patients having surgery or a procedure may have two visitors.  Children under the age of 37 must have an adult with them who is not the patient.  Merchandiser, Retail to address health-related social needs:  [email or web address]

## 2024-05-31 ENCOUNTER — Inpatient Hospital Stay: Admission: RE | Admit: 2024-05-31 | Discharge: 2024-05-31 | Attending: Surgery

## 2024-05-31 DIAGNOSIS — I10 Essential (primary) hypertension: Secondary | ICD-10-CM | POA: Diagnosis not present

## 2024-05-31 DIAGNOSIS — Z01818 Encounter for other preprocedural examination: Secondary | ICD-10-CM | POA: Insufficient documentation

## 2024-05-31 DIAGNOSIS — L729 Follicular cyst of the skin and subcutaneous tissue, unspecified: Secondary | ICD-10-CM | POA: Insufficient documentation

## 2024-05-31 LAB — BASIC METABOLIC PANEL WITH GFR
Anion gap: 14 (ref 5–15)
BUN: 12 mg/dL (ref 8–23)
CO2: 23 mmol/L (ref 22–32)
Calcium: 9.1 mg/dL (ref 8.9–10.3)
Chloride: 103 mmol/L (ref 98–111)
Creatinine, Ser: 0.71 mg/dL (ref 0.44–1.00)
GFR, Estimated: >60 mL/min (ref >60)
Glucose, Bld: 104 mg/dL — ABNORMAL HIGH (ref 70–99)
Potassium: 3.6 mmol/L (ref 3.5–5.1)
Sodium: 139 mmol/L (ref 135–145)

## 2024-05-31 LAB — CBC
HCT: 44 % (ref 36.0–46.0)
Hemoglobin: 15.4 g/dL — ABNORMAL HIGH (ref 12.0–15.0)
MCH: 29.8 pg (ref 26.0–34.0)
MCHC: 35 g/dL (ref 30.0–36.0)
MCV: 85.3 fL (ref 80.0–100.0)
Platelets: 249 K/uL (ref 150–400)
RBC: 5.16 MIL/uL — ABNORMAL HIGH (ref 3.87–5.11)
RDW: 13.1 % (ref 11.5–15.5)
WBC: 8.1 K/uL (ref 4.0–10.5)
nRBC: 0 % (ref 0.0–0.2)

## 2024-06-02 ENCOUNTER — Other Ambulatory Visit: Payer: Self-pay

## 2024-06-02 ENCOUNTER — Encounter: Admission: RE | Disposition: A | Payer: Self-pay | Attending: Surgery

## 2024-06-02 ENCOUNTER — Encounter: Payer: Self-pay | Admitting: Surgery

## 2024-06-02 ENCOUNTER — Encounter: Payer: Self-pay | Admitting: Urgent Care

## 2024-06-02 ENCOUNTER — Ambulatory Visit: Admitting: Certified Registered"

## 2024-06-02 ENCOUNTER — Ambulatory Visit
Admission: RE | Admit: 2024-06-02 | Discharge: 2024-06-02 | Disposition: A | Discharge status: Home / Self Care | Attending: Surgery | Admitting: Surgery

## 2024-06-02 DIAGNOSIS — G4730 Sleep apnea, unspecified: Secondary | ICD-10-CM | POA: Diagnosis not present

## 2024-06-02 DIAGNOSIS — L729 Follicular cyst of the skin and subcutaneous tissue, unspecified: Secondary | ICD-10-CM | POA: Diagnosis present

## 2024-06-02 DIAGNOSIS — L7211 Pilar cyst: Secondary | ICD-10-CM | POA: Diagnosis not present

## 2024-06-02 DIAGNOSIS — I10 Essential (primary) hypertension: Secondary | ICD-10-CM | POA: Insufficient documentation

## 2024-06-02 DIAGNOSIS — Z01818 Encounter for other preprocedural examination: Secondary | ICD-10-CM

## 2024-06-02 HISTORY — PX: LESION EXCISION: SHX5167

## 2024-06-02 SURGERY — EXCISION, LESION, SCALP
Anesthesia: General | Site: Scalp

## 2024-06-02 MED ORDER — FENTANYL CITRATE (PF) 100 MCG/2ML IJ SOLN
INTRAMUSCULAR | Status: DC | PRN
  Administered 2024-06-02 (×2): 50 ug via INTRAVENOUS

## 2024-06-02 MED ORDER — ONDANSETRON HCL 4 MG/2ML IJ SOLN
INTRAMUSCULAR | Status: DC | PRN
  Administered 2024-06-02: 4 mg via INTRAVENOUS

## 2024-06-02 MED ORDER — CEFAZOLIN SODIUM-DEXTROSE 2-4 GM/100ML-% IV SOLN
2.0000 g | INTRAVENOUS | Status: AC
  Administered 2024-06-02: 2 g via INTRAVENOUS

## 2024-06-02 MED ORDER — ORAL CARE MOUTH RINSE: 15.0000 mL | Freq: Once | OROMUCOSAL | Status: AC

## 2024-06-02 MED ORDER — DEXMEDETOMIDINE HCL IN NACL 80 MCG/20ML IV SOLN
INTRAVENOUS | Status: DC | PRN
  Administered 2024-06-02: 8 ug via INTRAVENOUS

## 2024-06-02 MED ORDER — OXYCODONE HCL 5 MG/5ML PO SOLN: 5.0000 mg | Freq: Once | ORAL | Status: AC | PRN

## 2024-06-02 MED ORDER — LIDOCAINE HCL (CARDIAC) PF 100 MG/5ML IV SOSY
PREFILLED_SYRINGE | INTRAVENOUS | Status: DC | PRN
  Administered 2024-06-02: 100 mg via INTRAVENOUS

## 2024-06-02 MED ORDER — ACETAMINOPHEN 325 MG PO TABS
650.0000 mg | ORAL_TABLET | Freq: Three times a day (TID) | ORAL | 0 refills | Status: AC | PRN
  Filled 2024-06-02: qty 40, 7d supply, fill #0

## 2024-06-02 MED ORDER — LACTATED RINGERS IV SOLN: INTRAVENOUS | Status: DC

## 2024-06-02 MED ORDER — LIDOCAINE HCL (PF) 1 % IJ SOLN
INTRAMUSCULAR | Status: AC
  Filled 2024-06-02: qty 30

## 2024-06-02 MED ORDER — BUPIVACAINE HCL (PF) 0.5 % IJ SOLN
INTRAMUSCULAR | Status: DC | PRN
  Administered 2024-06-02 (×2): 7 mL

## 2024-06-02 MED ORDER — PROPOFOL 1000 MG/100ML IV EMUL
INTRAVENOUS | Status: AC
  Filled 2024-06-02: qty 100

## 2024-06-02 MED ORDER — 0.9 % SODIUM CHLORIDE (POUR BTL) OPTIME
TOPICAL | Status: DC | PRN
  Administered 2024-06-02: 50 mL

## 2024-06-02 MED ORDER — CHLORHEXIDINE GLUCONATE 0.12 % MT SOLN
15.0000 mL | Freq: Once | OROMUCOSAL | Status: AC
  Administered 2024-06-02: 15 mL via OROMUCOSAL

## 2024-06-02 MED ORDER — MIDAZOLAM HCL 2 MG/2ML IJ SOLN
INTRAMUSCULAR | Status: AC
  Filled 2024-06-02: qty 2

## 2024-06-02 MED ORDER — FENTANYL CITRATE (PF) 100 MCG/2ML IJ SOLN: 25.0000 ug | INTRAMUSCULAR | Status: DC | PRN

## 2024-06-02 MED ORDER — GLYCOPYRROLATE 0.2 MG/ML IJ SOLN
INTRAMUSCULAR | Status: AC
  Filled 2024-06-02: qty 1

## 2024-06-02 MED ORDER — OXYCODONE HCL 5 MG PO TABS
5.0000 mg | ORAL_TABLET | Freq: Once | ORAL | Status: AC | PRN
  Administered 2024-06-02: 5 mg via ORAL

## 2024-06-02 MED ORDER — FENTANYL CITRATE (PF) 100 MCG/2ML IJ SOLN
INTRAMUSCULAR | Status: AC
  Filled 2024-06-02: qty 2

## 2024-06-02 MED ORDER — DEXAMETHASONE SOD PHOSPHATE PF 10 MG/ML IJ SOLN
INTRAMUSCULAR | Status: DC | PRN
  Administered 2024-06-02: 10 mg via INTRAVENOUS

## 2024-06-02 MED ORDER — LIDOCAINE HCL (PF) 2 % IJ SOLN
INTRAMUSCULAR | Status: AC
  Filled 2024-06-02: qty 5

## 2024-06-02 MED ORDER — ROCURONIUM BROMIDE 100 MG/10ML IV SOLN
INTRAVENOUS | Status: DC | PRN
  Administered 2024-06-02: 50 mg via INTRAVENOUS

## 2024-06-02 MED ORDER — CHLORHEXIDINE GLUCONATE 0.12 % MT SOLN
OROMUCOSAL | Status: AC
  Filled 2024-06-02: qty 15

## 2024-06-02 MED ORDER — CHLORHEXIDINE GLUCONATE CLOTH 2 % EX PADS: 6.0000 | MEDICATED_PAD | Freq: Once | CUTANEOUS | Status: DC

## 2024-06-02 MED ORDER — GLYCOPYRROLATE 0.2 MG/ML IJ SOLN
INTRAMUSCULAR | Status: DC | PRN
  Administered 2024-06-02: .1 mg via INTRAVENOUS

## 2024-06-02 MED ORDER — ROCURONIUM BROMIDE 10 MG/ML (PF) SYRINGE
PREFILLED_SYRINGE | INTRAVENOUS | Status: AC
  Filled 2024-06-02: qty 10

## 2024-06-02 MED ORDER — OXYCODONE-ACETAMINOPHEN 5-325 MG PO TABS
1.0000 | ORAL_TABLET | Freq: Three times a day (TID) | ORAL | 0 refills | Status: AC | PRN
  Filled 2024-06-02: qty 6, 2d supply, fill #0

## 2024-06-02 MED ORDER — BUPIVACAINE HCL (PF) 0.5 % IJ SOLN
INTRAMUSCULAR | Status: AC
  Filled 2024-06-02: qty 30

## 2024-06-02 MED ORDER — SUGAMMADEX SODIUM 200 MG/2ML IV SOLN
INTRAVENOUS | Status: DC | PRN
  Administered 2024-06-02: 150 mg via INTRAVENOUS

## 2024-06-02 MED ORDER — CEFAZOLIN SODIUM-DEXTROSE 2-4 GM/100ML-% IV SOLN
INTRAVENOUS | Status: AC
  Filled 2024-06-02: qty 100

## 2024-06-02 MED ORDER — PROPOFOL 10 MG/ML IV BOLUS
INTRAVENOUS | Status: DC | PRN
  Administered 2024-06-02: 150 ug/kg/min via INTRAVENOUS
  Administered 2024-06-02: 120 mg via INTRAVENOUS

## 2024-06-02 MED ORDER — ONDANSETRON HCL 4 MG/2ML IJ SOLN
INTRAMUSCULAR | Status: AC
  Filled 2024-06-02: qty 2

## 2024-06-02 MED ORDER — MIDAZOLAM HCL (PF) 2 MG/2ML IJ SOLN
INTRAMUSCULAR | Status: DC | PRN
  Administered 2024-06-02: 2 mg via INTRAVENOUS

## 2024-06-02 MED ORDER — DOCUSATE SODIUM 100 MG PO CAPS
100.0000 mg | ORAL_CAPSULE | Freq: Two times a day (BID) | ORAL | 0 refills | Status: AC | PRN
  Filled 2024-06-02: qty 20, 10d supply, fill #0

## 2024-06-02 MED ORDER — DEXMEDETOMIDINE HCL IN NACL 80 MCG/20ML IV SOLN
INTRAVENOUS | Status: AC
  Filled 2024-06-02: qty 20

## 2024-06-02 SURGICAL SUPPLY — 24 items
BLADE SURG 15 STRL LF DISP TIS (BLADE) ×1 IMPLANT
DERMABOND ADVANCED .7 DNX12 (GAUZE/BANDAGES/DRESSINGS) ×1 IMPLANT
DRAPE LAPAROTOMY 77X122 PED (DRAPES) ×1 IMPLANT
ELECTRODE REM PT RTRN 9FT ADLT (ELECTROSURGICAL) ×1 IMPLANT
GAUZE 4X4 16PLY ~~LOC~~+RFID DBL (SPONGE) ×1 IMPLANT
GLOVE BIOGEL PI IND STRL 7.0 (GLOVE) ×1 IMPLANT
GLOVE SURG SYN 6.5 PF PI (GLOVE) ×3 IMPLANT
GOWN STRL REUS W/ TWL LRG LVL3 (GOWN DISPOSABLE) ×3 IMPLANT
KIT TURNOVER KIT A (KITS) ×1 IMPLANT
LABEL OR SOLS (LABEL) ×1 IMPLANT
MANIFOLD NEPTUNE II (INSTRUMENTS) ×1 IMPLANT
NDL HYPO 22X1.5 SAFETY MO (MISCELLANEOUS) ×1 IMPLANT
NEEDLE HYPO 22X1.5 SAFETY MO (MISCELLANEOUS) ×1 IMPLANT
NS IRRIG 500ML POUR BTL (IV SOLUTION) ×1 IMPLANT
PACK BASIN MINOR ARMC (MISCELLANEOUS) ×1 IMPLANT
SOLN STERILE WATER 500 ML (IV SOLUTION) ×1 IMPLANT
SOLUTION PREP PVP 2OZ (MISCELLANEOUS) ×1 IMPLANT
SUCTION TUBE FRAZIER 10FR DISP (SUCTIONS) IMPLANT
SUT VIC AB 3-0 SH 27X BRD (SUTURE) ×1 IMPLANT
SUTURE MNCRL 4-0 27XMF (SUTURE) ×1 IMPLANT
SYR 30ML LL (SYRINGE) ×1 IMPLANT
SYR BULB IRRIG 60ML STRL (SYRINGE) ×1 IMPLANT
TOWEL OR 17X26 4PK STRL BLUE (TOWEL DISPOSABLE) ×1 IMPLANT
TRAP FLUID SMOKE EVACUATOR (MISCELLANEOUS) ×1 IMPLANT

## 2024-06-02 NOTE — Anesthesia Preprocedure Evaluation (Signed)
 "                                  Anesthesia Evaluation  Patient identified by MRN, date of birth, ID band Patient awake    Reviewed: Allergy & Precautions, NPO status , Patient's Chart, lab work & pertinent test results  History of Anesthesia Complications (+) PONV and history of anesthetic complications  Airway Mallampati: III  TM Distance: <3 FB Neck ROM: full    Dental  (+) Chipped   Pulmonary neg shortness of breath, sleep apnea    Pulmonary exam normal        Cardiovascular Exercise Tolerance: Good hypertension, (-) angina Normal cardiovascular exam     Neuro/Psych  Headaches CVA  negative psych ROS   GI/Hepatic negative GI ROS, Neg liver ROS,neg GERD  ,,  Endo/Other  negative endocrine ROS    Renal/GU      Musculoskeletal   Abdominal   Peds  Hematology negative hematology ROS (+)   Anesthesia Other Findings Past Medical History: No date: Dizziness No date: Headache No date: Hypertension No date: Lumbago No date: Mitral valve disorder No date: Ovarian cyst No date: PONV (postoperative nausea and vomiting) No date: Sleep apnea No date: Stroke [facility])  Past Surgical History: No date: arm surgery; Right 1998: BLADDER SURGERY 08/13/2013: BREAST SURGERY; Right     Comment:  right breast core  06/15/1998: CHOLECYSTECTOMY 02/09/2024: COLONOSCOPY; N/A     Comment:  Procedure: COLONOSCOPY;  Surgeon: Unk Corinn Skiff,               MD;  Location: [facility] ENDOSCOPY;  Service:               Gastroenterology;  Laterality: N/A; 02/09/2024: ESOPHAGOGASTRODUODENOSCOPY; N/A     Comment:  Procedure: EGD (ESOPHAGOGASTRODUODENOSCOPY);  Surgeon:               Unk Corinn Skiff, MD;  Location: [facility] ENDOSCOPY;                Service: Gastroenterology;  Laterality: N/A; 02/09/2024: POLYPECTOMY     Comment:  Procedure: POLYPECTOMY, INTESTINE;  Surgeon: Unk Corinn Skiff, MD;  Location: [facility] ENDOSCOPY;  Service:                Gastroenterology;;  BMI    Body Mass Index: 29.48 kg/m      Reproductive/Obstetrics negative OB ROS                              Anesthesia Physical Anesthesia Plan  ASA: 3  Anesthesia Plan: General ETT   Post-op Pain Management:    Induction: Intravenous  PONV Risk Score and Plan: Ondansetron, Dexamethasone, Midazolam, Treatment may vary due to age or medical condition and Propofol  infusion  Airway Management Planned: Oral ETT  Additional Equipment:   Intra-op Plan:   Post-operative Plan: Extubation in OR  Informed Consent: I have reviewed the patients History and Physical, chart, labs and discussed the procedure including the risks, benefits and alternatives for the proposed anesthesia with the patient or authorized representative who has indicated his/her understanding and acceptance.     Dental Advisory Given  Plan Discussed with: Anesthesiologist, CRNA and Surgeon  Anesthesia Plan Comments: (Patient consented for risks of anesthesia including but not limited to:  -  adverse reactions to medications - damage to eyes, teeth, lips or other oral mucosa - nerve damage due to positioning  - sore throat or hoarseness - Damage to heart, brain, nerves, lungs, other parts of body or loss of life  Patient voiced understanding and assent.)        Anesthesia Quick Evaluation  "

## 2024-06-02 NOTE — Discharge Instructions (Signed)
 Removal, Care After This sheet gives you information about how to care for yourself after your procedure. Your health care provider may also give you more specific instructions. If you have problems or questions, contact your health care provider. What can I expect after the procedure? After the procedure, it is common to have: Soreness. Bruising. Itching. Follow these instructions at home: site care Follow instructions from your health care provider about how to take care of your site. Make sure you: Wash your hands with soap and water before and after you change your bandage (dressing). If soap and water are not available, use hand sanitizer. Leave stitches (sutures), skin glue, or adhesive strips in place. These skin closures may need to stay in place for 2 weeks or longer. If adhesive strip edges start to loosen and curl up, you may trim the loose edges. Do not remove adhesive strips completely unless your health care provider tells you to do that. If the area bleeds or bruises, apply gentle pressure for 10 minutes. OK TO SHOWER IN 24HRS  Check your site every day for signs of infection. Check for: Redness, swelling, or pain. Fluid or blood. Warmth. Pus or a bad smell.  General instructions Rest and then return to your normal activities as told by your health care provider.  tylenol  as needed for discomfort.    Use narcotics, if prescribed, only when tylenol   is not enough to control pain.  325-650mg  every 8hrs to max of 3000mg /24hrs (including the 325mg  in every norco dose) for the tylenol .   Resume aspirin in 48 hours Keep all follow-up visits as told by your health care provider. This is important. Contact a health care provider if: You have redness, swelling, or pain around your site. You have fluid or blood coming from your site. Your site feels warm to the touch. You have pus or a bad smell coming from your site. You have a fever. Your sutures, skin glue, or adhesive  strips loosen or come off sooner than expected. Get help right away if: You have bleeding that does not stop with pressure or a dressing. Summary After the procedure, it is common to have some soreness, bruising, and itching at the site. Follow instructions from your health care provider about how to take care of your site. Check your site every day for signs of infection. Contact a health care provider if you have redness, swelling, or pain around your site, or your site feels warm to the touch. Keep all follow-up visits as told by your health care provider. This is important. This information is not intended to replace advice given to you by your health care provider. Make sure you discuss any questions you have with your health care provider. Document Released: 06/28/2015 Document Revised: 11/29/2017 Document Reviewed: 11/29/2017 Elsevier Interactive Patient Education  [employer].

## 2024-06-02 NOTE — Anesthesia Procedure Notes (Signed)
 Procedure Name: Intubation Date/Time: 06/02/2024 3:16 PM  Performed by: Jackye Spanner, CRNAPre-anesthesia Checklist: Patient identified, Patient being monitored, Timeout performed, Emergency Drugs available and Suction available Patient Re-evaluated:Patient Re-evaluated prior to induction Oxygen Delivery Method: Circle system utilized Preoxygenation: Pre-oxygenation with 100% oxygen Induction Type: IV induction Ventilation: Mask ventilation without difficulty Laryngoscope Size: 3 and McGrath Grade View: Grade I Tube type: Oral Tube size: 7.0 mm Number of attempts: 1 Airway Equipment and Method: Stylet Placement Confirmation: ETT inserted through vocal cords under direct vision, positive ETCO2 and breath sounds checked- equal and bilateral Secured at: 21 cm Tube secured with: Tape Dental Injury: Teeth and Oropharynx as per pre-operative assessment  Comments: Smooth atraumatic intubation, no complications noted.

## 2024-06-02 NOTE — Anesthesia Postprocedure Evaluation (Signed)
"   Anesthesia Post Note  Patient: Julie Cantu  Procedure(s) Performed: EXCISION, LESION, SCALP (Scalp)  Patient location during evaluation: PACU Anesthesia Type: General Level of consciousness: awake and alert Pain management: pain level controlled Vital Signs Assessment: post-procedure vital signs reviewed and stable Respiratory status: spontaneous breathing, nonlabored ventilation and respiratory function stable Cardiovascular status: blood pressure returned to baseline and stable Postop Assessment: no apparent nausea or vomiting Anesthetic complications: no   No notable events documented.   Last Vitals:  Vitals:   06/02/24 1645 06/02/24 1700  BP: 108/68 127/74  Pulse: 61 (!) 54  Resp: 16 15  Temp:    SpO2: 100% 100%    Last Pain:  Vitals:   06/02/24 1630  TempSrc:   PainSc: Asleep                 Fairy POUR Kela Baccari      "

## 2024-06-02 NOTE — Op Note (Signed)
 Pre-Op Dx: pilar scalp x2 Post-Op Dx: same Anesthesia: GETA EBL: 40mL Complications:  none apparent Specimen: pilar scalp x2 Procedure: excisional biopsy of pilar scalp x2 Surgeon: Tye  Indications for procedure: See H&P  Description of Procedure:  Consent obtained, time out performed.  Patient placed in prone position initially, Area sterilized and draped in usual position.  Local infused to area previously marked.  4.5cm incision made through dermis with 15blade and pilar cyst noted under scalp in occiput region.  The  4cm x 4cm x 3cm cyst then removed from surrounding tissue completely using electrocautery, passed off field pending pathology.  Wound hemostasis noted, then closed with running 4-0 monocryl in subcuticular fashion for scalp layer.  Wound then dressed with dermabond.   Pt then transferred to cart and flipped to supine position, still intubated.  Second area sterilized and draped in usual position.  Local infused to area previously marked.  3cm incision made through dermis with 15blade and pilar cyst noted under scalp in occiput region.  The  2cm x 2cm x 3cm cyst then removed from surrounding tissue completely using electrocautery, passed off field pending pathology.  Bleeding controlled with electrocautery and manual pressure. Wound hemostasis noted, then closed with running 4-0 monocryl in subcuticular fashion for scalp layer.  Wound then dressed with dermabond. Pt tolerated procedure well, and transferred to PACU in stable condition. Sponge and instrument count correct at end of procedure.

## 2024-06-02 NOTE — Interval H&P Note (Signed)
 No change. OK to proceed.

## 2024-06-02 NOTE — Transfer of Care (Signed)
 Immediate Anesthesia Transfer of Care Note  Patient: Julie Cantu  Procedure(s) Performed: EXCISION, LESION, SCALP (Scalp)  Patient Location: PACU  Anesthesia Type:General  Level of Consciousness: drowsy  Airway & Oxygen Therapy: Patient Spontanous Breathing and Patient connected to face mask oxygen  Post-op Assessment: Report given to RN and Post -op Vital signs reviewed and stable  Post vital signs: Reviewed and stable  Last Vitals:  Vitals Value Taken Time  BP 117/53 06/02/24 16:30  Temp 36.1 1629  Pulse 59 06/02/24 16:34  Resp 14 06/02/24 16:34  SpO2 100 % 06/02/24 16:34  Vitals shown include unfiled device data.  Last Pain:  Vitals:   06/02/24 1429  TempSrc: Tympanic  PainSc: 0-No pain         Complications: No notable events documented.

## 2024-06-03 ENCOUNTER — Encounter: Payer: Self-pay | Admitting: Surgery

## 2024-06-06 LAB — SURGICAL PATHOLOGY

## 2024-06-10 NOTE — Progress Notes (Signed)
Pt c/o flu symptoms x 2 days

## 2024-06-10 NOTE — Progress Notes (Addendum)
 Subjective Patient ID: Julie Cantu is a 63 y.o. female.    Julie Cantu is a 63 y.o. female presents to the clinic complaining of nasal congestion, headache, sore throat, post nasal drip, sinus pressure for the past 2 days.  Patient has not taken any medications for her symptoms.  She reports that she will occasionally get sinus infections.  Denies known sick contacts.  Denies history of asthma or other breathing problems.   History provided by:  Patient Language interpreter used: No     Review of Systems  Constitutional:  Negative for chills, fatigue and fever.  HENT:  Positive for congestion, postnasal drip, sinus pressure, sinus pain and sore throat. Negative for ear discharge, ear pain, rhinorrhea, trouble swallowing and voice change.   Respiratory:  Negative for cough, chest tightness and shortness of breath.   Musculoskeletal:  Negative for myalgias.  Neurological:  Negative for headaches.    Patient History  Allergies: No Known Allergies  History reviewed. No pertinent past medical history. History reviewed. No pertinent surgical history. Social History   Socioeconomic History   Marital status: Not on file    Spouse name: Not on file   Number of children: Not on file   Years of education: Not on file   Highest education level: Not on file  Occupational History   Not on file  Tobacco Use   Smoking status: Never   Smokeless tobacco: Never  Substance and Sexual Activity   Alcohol use: Not on file   Drug use: Not on file   Sexual activity: Not on file  Other Topics Concern   Not on file  Social History Narrative   Not on file   History reviewed. No pertinent family history. No current outpatient medications on file prior to visit.   No current facility-administered medications on file prior to visit.    Objective  Vitals:   06/10/24 1449  BP: (!) 151/86  Pulse: (!) 103  Resp: 18  Temp: 36.9 C (98.4 F)   TempSrc: Oral  SpO2: 98%  Weight: 70.3 kg  Height: 6' 1  PainSc:   4        OBGYN/Pregnancy Status: Postmenopausal      No results found.  Physical Exam Vitals and nursing note reviewed.  Constitutional:      Appearance: Normal appearance.  HENT:     Head: Normocephalic.     Right Ear: Tympanic membrane, ear canal and external ear normal.     Left Ear: Tympanic membrane, ear canal and external ear normal.     Nose: No congestion or rhinorrhea.     Right Turbinates: Not enlarged or swollen.     Left Turbinates: Not enlarged or swollen.     Right Sinus: No maxillary sinus tenderness or frontal sinus tenderness.     Left Sinus: No maxillary sinus tenderness or frontal sinus tenderness.     Mouth/Throat:     Lips: Pink.     Mouth: Mucous membranes are moist.     Tongue: No lesions.     Palate: No lesions.     Pharynx: Oropharynx is clear. Uvula midline. Posterior oropharyngeal erythema and postnasal drip present.     Tonsils: No tonsillar exudate. 0 on the right. 0 on the left.  Eyes:     Conjunctiva/sclera: Conjunctivae normal.  Neck:     Trachea: Phonation normal.  Cardiovascular:     Rate and Rhythm: Normal rate and regular rhythm.     Heart sounds: Normal heart  sounds.  Pulmonary:     Effort: Pulmonary effort is normal.     Breath sounds: Normal breath sounds and air entry.     Comments: Speaking in full sentences, no increase in work of breathing ambulating through the clinic.   Musculoskeletal:     Cervical back: Normal range of motion and neck supple. No rigidity. Normal range of motion.  Lymphadenopathy:     Head:     Right side of head: No submental, submandibular, tonsillar, preauricular, posterior auricular or occipital adenopathy.     Left side of head: No submental, submandibular, tonsillar, preauricular, posterior auricular or occipital adenopathy.     Cervical: No cervical adenopathy.  Skin:    General: Skin is warm and dry.     Capillary Refill:  Capillary refill takes less than 2 seconds.  Neurological:     Mental Status: She is alert.  Psychiatric:        Mood and Affect: Mood normal.        Behavior: Behavior normal.     Results for orders placed or performed in visit on 06/10/24  POCT QuickVue Antigen Test  Component Result   Rapid Covid Negative   Internal Quality Control Pass  POCT RAPID INFLUENZA MCKESSON  Component Result   Rapid Influenza A Ag Negative   Rapid Influenza B Ag Negative   Internal Quality Control Pass  POCT rapid strep A manually resulted  Component Result   Rapid Strep A Screen Negative   Internal Quality Control Pass       Procedures MDM:     1 Acute, uncomplicated illness or injury     Explanation of Medical Decision Making and variances from expected care:    Negative COVID, influenza, and rapid strep.  Based on patient presentation and symptoms, suspect viral etiology as cause of patient's symptoms.  Discussed with patient that symptoms are likely viral and do not require antibiotics.  Discussed expectations and duration of illness. RX atrovent nasal spray.  Patient given single dose of Decadron  to help with pharyngitis symptoms. Patient was offered viscous lidocaine , however, she reports that she cannot do numbing agents as this makes her feel like she cannot breathe. Discussed supportive care and OTC medications for symptomatic management. Recheck if worsening or not improving.  Recommended PCP follow up.  Return precautions given.     Unique ordered tests: Three+     Review of any test results: Three+     Assessment requiring historian other than patient: No     Independent visualization of image, tracing, or test: No     Discussion of management with another provider: No     Risk:: Moderate            Assessment/Plan Diagnoses and all orders for this visit:  Acute pharyngitis, unspecified etiology -     dexamethasone  (Decadron ) injection 10 mg  Nasal congestion -      POCT QuickVue Antigen Test -     POCT RAPID INFLUENZA MCKESSON -     ipratropium (Atrovent) 0.06 % nasal spray; Administer 2 sprays into each nostril 4 (four) times a day.  Sinus headache -     POCT QuickVue Antigen Test -     POCT RAPID INFLUENZA MCKESSON -     ipratropium (Atrovent) 0.06 % nasal spray; Administer 2 sprays into each nostril 4 (four) times a day.  Sore throat -     POCT rapid strep A manually resulted     Disposition Status: Home  Patient Instructions  ?? Discharge Instructions: Viral Upper Respiratory Infection (URI)  You have been diagnosed with a viral upper respiratory infection, commonly known as a cold or viral sore throat. This illness is caused by a virus and typically resolves on its own in 7-10 days. Heres how you can take care of yourself at home:  ?? Home Care Tips ?? Stay Hydrated: Drink plenty of fluids (water, tea, soup) to stay hydrated and loosen mucus. Rest: Get enough sleep to help your body fight the virus. ??? Manage Fever/Pain: You may use over-the-counter medications like: Acetaminophen  (Tylenol ) or Ibuprofen (Advil, Motrin) Follow package instructions unless told otherwise. Use Humidifiers/Steam: A cool-mist humidifier or steamy shower can ease congestion and soothe your throat. Saltwater Gargle: Gargling with warm salt water can help with a sore throat.  ? What NOT to Do ?? No Antibiotics Needed: Antibiotics do not work against viruses and are not recommended in your case. ?? Avoid Smoking/Vaping: These can worsen symptoms and delay healing. Limit Alcohol: Alcohol can dehydrate you and suppress your immune system.  ?? When to Seek Medical Help Call your healthcare provider or return to the clinic if you experience: Difficulty breathing or shortness of breath ?? Fever lasting more than 3 days Symptoms that worsen after initial improvement Coughing up blood or thick green/bloody mucus Severe headache, ear pain, or facial pain  ?  Recovery Reminder Most people start to feel better within a few days, but cough and fatigue may linger for 1-2 weeks. Be patient with your recovery, and take care of yourself!   Progress note signed by Gerard Gaskins, PA on 06/10/24 at  4:56 PM
